# Patient Record
Sex: Male | Born: 1975
Health system: Southern US, Community
[De-identification: ages and names within clinical notes are randomized; demographics above are authoritative.]

## PROBLEM LIST (undated history)

## (undated) DIAGNOSIS — T8859XA Other complications of anesthesia, initial encounter: Secondary | ICD-10-CM

## (undated) DIAGNOSIS — F419 Anxiety disorder, unspecified: Secondary | ICD-10-CM

## (undated) DIAGNOSIS — R112 Nausea with vomiting, unspecified: Secondary | ICD-10-CM

## (undated) DIAGNOSIS — R011 Cardiac murmur, unspecified: Secondary | ICD-10-CM

## (undated) DIAGNOSIS — I10 Essential (primary) hypertension: Secondary | ICD-10-CM

## (undated) DIAGNOSIS — E785 Hyperlipidemia, unspecified: Secondary | ICD-10-CM

## (undated) DIAGNOSIS — A159 Respiratory tuberculosis unspecified: Secondary | ICD-10-CM

## (undated) DIAGNOSIS — Q8740 Marfan's syndrome, unspecified: Secondary | ICD-10-CM

## (undated) DIAGNOSIS — F32A Depression, unspecified: Secondary | ICD-10-CM

## (undated) DIAGNOSIS — T4145XA Adverse effect of unspecified anesthetic, initial encounter: Secondary | ICD-10-CM

## (undated) DIAGNOSIS — G4730 Sleep apnea, unspecified: Secondary | ICD-10-CM

## (undated) DIAGNOSIS — F329 Major depressive disorder, single episode, unspecified: Secondary | ICD-10-CM

## (undated) DIAGNOSIS — Z9889 Other specified postprocedural states: Secondary | ICD-10-CM

## (undated) DIAGNOSIS — K219 Gastro-esophageal reflux disease without esophagitis: Secondary | ICD-10-CM

## (undated) DIAGNOSIS — G4733 Obstructive sleep apnea (adult) (pediatric): Secondary | ICD-10-CM

## (undated) DIAGNOSIS — E669 Obesity, unspecified: Secondary | ICD-10-CM

## (undated) HISTORY — PX: EYE SURGERY: SHX253

## (undated) HISTORY — DX: Hyperlipidemia, unspecified: E78.5

## (undated) HISTORY — DX: Essential (primary) hypertension: I10

## (undated) HISTORY — PX: OTHER SURGICAL HISTORY: SHX169

## (undated) HISTORY — PX: CARDIAC VALVE REPLACEMENT: SHX585

## (undated) HISTORY — DX: Obesity, unspecified: E66.9

## (undated) HISTORY — DX: Obstructive sleep apnea (adult) (pediatric): G47.33

## (undated) HISTORY — DX: Sleep apnea, unspecified: G47.30

---

## 2008-09-27 ENCOUNTER — Emergency Department (HOSPITAL_COMMUNITY): Admission: EM | Admit: 2008-09-27 | Discharge: 2008-09-27 | Payer: Self-pay | Admitting: Family Medicine

## 2013-02-26 ENCOUNTER — Other Ambulatory Visit: Payer: Self-pay | Admitting: Internal Medicine

## 2013-02-26 MED ORDER — INSULIN DETEMIR 100 UNIT/ML ~~LOC~~ SOLN: 30.0000 [IU] | Freq: Every day | SUBCUTANEOUS | Status: DC

## 2013-02-26 MED ORDER — INSULIN NPH (HUMAN) (ISOPHANE) 100 UNIT/ML ~~LOC~~ SUSP: 15.0000 [IU] | Freq: Two times a day (BID) | SUBCUTANEOUS | Status: DC

## 2013-09-18 ENCOUNTER — Encounter: Payer: Self-pay | Admitting: Sports Medicine

## 2013-09-18 ENCOUNTER — Ambulatory Visit (INDEPENDENT_AMBULATORY_CARE_PROVIDER_SITE_OTHER): Payer: 59 | Admitting: Sports Medicine

## 2013-09-18 ENCOUNTER — Ambulatory Visit (INDEPENDENT_AMBULATORY_CARE_PROVIDER_SITE_OTHER): Payer: 59

## 2013-09-18 VITALS — BP 133/85 | HR 66 | Ht >= 80 in | Wt 374.0 lb

## 2013-09-18 DIAGNOSIS — M25561 Pain in right knee: Secondary | ICD-10-CM

## 2013-09-18 DIAGNOSIS — M898X9 Other specified disorders of bone, unspecified site: Secondary | ICD-10-CM

## 2013-09-18 DIAGNOSIS — M25569 Pain in unspecified knee: Secondary | ICD-10-CM

## 2013-09-18 DIAGNOSIS — M25562 Pain in left knee: Principal | ICD-10-CM

## 2013-09-18 DIAGNOSIS — M25869 Other specified joint disorders, unspecified knee: Secondary | ICD-10-CM

## 2013-09-18 DIAGNOSIS — M25469 Effusion, unspecified knee: Secondary | ICD-10-CM

## 2013-09-18 DIAGNOSIS — M170 Bilateral primary osteoarthritis of knee: Secondary | ICD-10-CM | POA: Insufficient documentation

## 2013-09-18 DIAGNOSIS — Q8740 Marfan's syndrome, unspecified: Secondary | ICD-10-CM

## 2013-09-18 MED ORDER — IBUPROFEN 800 MG PO TABS: 800.0000 mg | ORAL_TABLET | Freq: Three times a day (TID) | ORAL | Status: DC | PRN

## 2013-09-18 NOTE — Assessment & Plan Note (Signed)
Most likely related to osteoarthritis, and likely degenerative meniscal tear. He tells me that he does have an anterior cruciate ligament rupture, but does desire to proceed with nonoperative measures. Bilateral injection as above, formal physical therapy, ibuprofen, x-rays. Return in one month. Patient will purchase knee sleeves on his own.

## 2013-09-18 NOTE — Progress Notes (Signed)
Subjective:    I'm seeing this patient as a consultation for:  Dr. Leonette MostKalish  CC: Bilateral knee pain  HPI: This is a very pleasant 38 year old male with a history of Marfan syndrome, post aortic root replacement, he's also had bunion surgery. For a long time now he's had bilateral knee pain, he has had MRIs in the past but it showed areas of torn cartilage per patient. 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 It was never decided to operate in the past, and he has decided to proceed with nonsurgical methods. He localizes the pain deep in the joint on both sides, moderate, persistent, mild swelling. No mechanical symptoms. He does have significant pain in the right knee with terminal flexion.  Past medical history, Surgical history, Family history not pertinant except as noted below, Social history, Allergies, and medications have been entered into the medical record, reviewed, and no changes needed.   Review of Systems: No headache, visual changes, nausea, vomiting, diarrhea, constipation, dizziness, abdominal pain, skin rash, fevers, chills, night sweats, weight loss, swollen lymph nodes, body aches, joint swelling, muscle aches, chest pain, shortness of breath, mood changes, visual or auditory hallucinations.   Objective:   General: Well Developed, well nourished, and in no acute distress.  Neuro/Psych: Alert and oriented x3, extra-ocular muscles intact, able to move all 4 extremities, sensation grossly intact. Skin: Warm and dry, no rashes noted.  Respiratory: Not using accessory muscles, speaking in full sentences, trachea midline.  Cardiovascular: Pulses palpable, no extremity edema. Abdomen: Does not appear distended. Bilateral Knee: Normal to inspection with no erythema or effusion or obvious bony abnormalities. Palpation normal with no warmth, joint line tenderness, patellar tenderness, or condyle tenderness. ROM full in flexion and  extension and lower leg rotation. Right knee pain with terminal flexion Ligaments with solid consistent endpoints including PCL, LCL, MCL.  Anterior cruciate ligament felt loose on both sides. Negative Mcmurray's, Apley's, and Thessalonian tests. Non painful patellar compression. Patellar glide without crepitus. Patellar and quadriceps tendons unremarkable. Hamstring and quadriceps strength is normal.   Procedure: Real-time Ultrasound Guided Injection of left knee Device: GE Logiq E  Verbal informed consent obtained.  Time-out conducted.  Noted no overlying erythema, induration, or other signs of local infection.  Skin prepped in a sterile fashion.  Local anesthesia: Topical Ethyl chloride.  With sterile technique and under real time ultrasound guidance:  2 cc Kenalog 40, 4 cc lidocaine injected into the suprapatellar recess. Completed without difficulty  Pain immediately resolved suggesting accurate placement of the medication.  Advised to call if fevers/chills, erythema, induration, drainage, or persistent bleeding.  Images permanently stored and available for review in the ultrasound unit.  Impression: Technically successful ultrasound guided injection.  Procedure: Real-time Ultrasound Guided Injection of right knee Device: GE Logiq E  Verbal informed consent obtained.  Time-out conducted.  Noted no overlying erythema, induration, or other signs of local infection.  Skin prepped in a sterile fashion.  Local anesthesia: Topical Ethyl chloride.  With sterile technique and under real time ultrasound guidance:  2 cc Kenalog 40, 4 cc lidocaine injected into the suprapatellar recess. Completed without difficulty  Pain immediately resolved suggesting accurate placement of the medication.  Advised to call if fevers/chills, erythema, induration, drainage, or persistent bleeding.  Images permanently stored and available for review in the ultrasound unit.  Impression: Technically successful  ultrasound guided injection.  Impression and Recommendations:   This case required medical decision  making of moderate complexity.

## 2013-09-26 ENCOUNTER — Encounter: Payer: 59 | Admitting: Sports Medicine

## 2013-10-02 ENCOUNTER — Ambulatory Visit: Payer: 59 | Attending: Sports Medicine | Admitting: Physical Therapy

## 2013-10-02 DIAGNOSIS — M25569 Pain in unspecified knee: Secondary | ICD-10-CM | POA: Insufficient documentation

## 2013-10-02 DIAGNOSIS — IMO0001 Reserved for inherently not codable concepts without codable children: Secondary | ICD-10-CM | POA: Insufficient documentation

## 2013-10-09 ENCOUNTER — Encounter: Payer: 59 | Admitting: Physical Therapy

## 2013-10-16 ENCOUNTER — Ambulatory Visit: Payer: 59 | Admitting: Physical Therapy

## 2013-10-16 ENCOUNTER — Encounter: Payer: 59 | Admitting: Sports Medicine

## 2013-10-17 ENCOUNTER — Ambulatory Visit (INDEPENDENT_AMBULATORY_CARE_PROVIDER_SITE_OTHER): Payer: 59 | Admitting: Sports Medicine

## 2013-10-17 ENCOUNTER — Encounter: Payer: Self-pay | Admitting: Sports Medicine

## 2013-10-17 VITALS — BP 137/88 | HR 63 | Ht >= 80 in | Wt 379.0 lb

## 2013-10-17 DIAGNOSIS — M25569 Pain in unspecified knee: Secondary | ICD-10-CM

## 2013-10-17 DIAGNOSIS — M25561 Pain in right knee: Secondary | ICD-10-CM

## 2013-10-17 DIAGNOSIS — M25562 Pain in left knee: Principal | ICD-10-CM

## 2013-10-17 NOTE — Assessment & Plan Note (Signed)
Essentially resolved after injection. Custom orthotics as above. Return as needed, I can add a scaphoid as he does have very severe pes planus.

## 2013-10-17 NOTE — Progress Notes (Signed)
    Patient was fitted for a : standard, cushioned, semi-rigid orthotic. The orthotic was heated and afterward the patient stood on the orthotic blank positioned on the orthotic stand. The patient was positioned in subtalar neutral position and 10 degrees of ankle dorsiflexion in a weight bearing stance. After completion of molding, a stable base was applied to the orthotic blank. The blank was ground to a stable position for weight bearing. Size: 18 Base: Blue EVA Additional Posting and Padding: None The patient ambulated these, and they were very comfortable.  I spent 40 minutes with this patient, greater than 50% was face-to-face time counseling regarding the below diagnosis.

## 2013-10-18 ENCOUNTER — Ambulatory Visit: Payer: 59 | Admitting: Physical Therapy

## 2013-10-19 ENCOUNTER — Encounter: Payer: Self-pay | Admitting: Sports Medicine

## 2013-10-19 ENCOUNTER — Ambulatory Visit (INDEPENDENT_AMBULATORY_CARE_PROVIDER_SITE_OTHER): Payer: 59 | Admitting: Sports Medicine

## 2013-10-19 VITALS — BP 119/67 | HR 87 | Ht >= 80 in | Wt 376.0 lb

## 2013-10-19 DIAGNOSIS — M25561 Pain in right knee: Secondary | ICD-10-CM

## 2013-10-19 DIAGNOSIS — M25569 Pain in unspecified knee: Secondary | ICD-10-CM

## 2013-10-19 DIAGNOSIS — M25562 Pain in left knee: Principal | ICD-10-CM

## 2013-10-19 NOTE — Assessment & Plan Note (Signed)
Essentially resolved with custom orthotics and injection of month ago. Return as needed, we can always do a repeat steroid injection, if pain recurs within 2 months we can proceed with Visco supplementation.

## 2013-10-19 NOTE — Progress Notes (Signed)
  Subjective:    CC: Followup  HPI: Knee osteoarthritis: Robert Stevenson is a pleasant 38 year old male with Marfan syndrome, multiple we injected both knees, and recently we made him custom orthotics, is happy with the results of far, and does not desire any further interventional treatment.  Past medical history, Surgical history, Family history not pertinant except as noted below, Social history, Allergies, and medications have been entered into the medical record, reviewed, and no changes needed.   Review of Systems: No fevers, chills, night sweats, weight loss, chest pain, or shortness of breath.   Objective:    General: Well Developed, well nourished, and in no acute distress.  Neuro: Alert and oriented x3, extra-ocular muscles intact, sensation grossly intact.  HEENT: Normocephalic, atraumatic, pupils equal round reactive to light, neck supple, no masses, no lymphadenopathy, thyroid nonpalpable.  Skin: Warm and dry, no rashes. Cardiac: Regular rate and rhythm, no murmurs rubs or gallops, no lower extremity edema.  Respiratory: Clear to auscultation bilaterally. Not using accessory muscles, speaking in full sentences.  Impression and Recommendations:

## 2013-10-23 ENCOUNTER — Ambulatory Visit: Payer: 59 | Admitting: Physical Therapy

## 2013-10-25 ENCOUNTER — Ambulatory Visit: Payer: 59 | Admitting: Physical Therapy

## 2013-11-06 ENCOUNTER — Ambulatory Visit: Payer: 59 | Attending: Sports Medicine | Admitting: Physical Therapy

## 2013-11-06 DIAGNOSIS — IMO0001 Reserved for inherently not codable concepts without codable children: Secondary | ICD-10-CM | POA: Insufficient documentation

## 2013-11-06 DIAGNOSIS — M25569 Pain in unspecified knee: Secondary | ICD-10-CM | POA: Insufficient documentation

## 2013-11-09 ENCOUNTER — Encounter: Payer: Self-pay | Admitting: Dietician

## 2013-11-09 ENCOUNTER — Encounter: Payer: 59 | Attending: Family Medicine | Admitting: Dietician

## 2013-11-09 VITALS — Ht >= 80 in | Wt 378.0 lb

## 2013-11-09 DIAGNOSIS — Z713 Dietary counseling and surveillance: Secondary | ICD-10-CM | POA: Insufficient documentation

## 2013-11-09 DIAGNOSIS — E669 Obesity, unspecified: Secondary | ICD-10-CM | POA: Diagnosis not present

## 2013-11-09 NOTE — Patient Instructions (Addendum)
Eat breakfast every day.  Choose protein shakes or cereal bars or greek yogurts with 10 grams or less than sugar.  If eating breakfast or other meals out choose whole wheat grain options, lean proteins, choosing vegetables and fruits as sides, and be careful of added calories and fats in things like condiments and dressings.  Take the time every 4-5 hours to check in with yourself and assess your hunger.  If hungry eat snacks between meals. Choose small changes that you feel are sustainable and build upon those as we gradually build-on your healthy eating habits. Try and choose water or 0 calorie beverages more often than sweet tea or fruit juices.  Try ordering half unsweet half sweet or making tea at home with splenda.  If water is getting boring use crystal light or fruit wedges of lemon or lime to flavor it. Continue to exercise!!

## 2013-11-09 NOTE — Progress Notes (Signed)
Medical Nutrition Therapy:  Appt start time: 0800 end time:  0900.   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  Samvel's highest weight was 431 in 2008.  In 2010/11 he moved to [facility] and lost 50 lbs with exercise, down to 380 lbs.  After he had heart surgery in 2012 he gained back up to 400 lbs.  He then joined [employer] and lost down to 350 lbs.  After a couple eye surgeries he regained some weight, and joined an exercise program at [facility] which he recently completed.  Raynaldo is thinking of rejoining weight watchers but wanted to meet with a dietitian first before deciding his next plan of action.  His weight loss goal is to lose 100 lbs down to 278 lbs.  I calculate his IBW to be approximately 255 lbs. Zaydan lives with his wife and his daughter and states his wife does most of the food shopping and cooking.  He recently moved homes and he states with their schedule they go out to eat a lot, maybe 10-12 meals a week.  They go to places like [employer], [place], [place].  At fast-food places he states he will often order a combo plus something off the dollar menu and has recently observed that he is not really hungry for it and that he has been ignoring his hunger and fullness.  He states he is not a big regular soda drinker (will drink diet) but he loves sweet tea and fruit juices.    Preferred Learning Style all of the following:  Auditory  Visual  Hands on  Learning Readiness:  Ready  MEDICATIONS: see list   DIETARY INTAKE:  24-hr recall:  B ( AM): skips breakfast, or get [employer], [employer], [place]: combo chicken biscuit, fries, OJ  Snk ( AM): none  2 pm L ( PM): Cafeteria at [employer] , pasta plate (shrimp tortellini) or [employer] 4-5 pm Snk ( PM): buffalo potato chips by [employer] or trail mix D ( PM): out to eat [employer], etc. Snk ( PM): none Beverages:  water, diet Dr. Reino Kent, diet Mtn. Dew, sweet tea, fruit juices  Usual physical activity: none currently.  Just finished an exercise program and plans to start working out at the gym facilities in his new apartment complex.  Progress Towards Goal(s):  In progress.   Nutritional Diagnosis:  [place]-3.3 Overweight/obesity As related to frequent consumption of foods outside of the home, large portion sizes, and mindless eating.  As evidenced by dietary recall and BMI of 40.6.Marland Kitchen    Intervention:  Nutrition counseling and education.  Discussed importance of eating breakfast and eating regular smaller meals throughout the day.  Encouraged Mataeo to get back in tune with his hunger and fullness and really take the time to think about what will satisfy him.  Outlined for Diontre that it takes approximately a 3500 calorie deduction to lose 1 lb and that this can be reached by decreasing calories (500 calories a day) and increasing exercise (30-60 min 5-7 days a week).  Discussed importance of choosing more fiber and protein to replace simple sugars and starches to help with staying power so that he does not feel hungry an hour or two after eating a meal.  Together we came up with the following plan:  Eat breakfast every day.  Choose protein shakes or cereal bars or greek yogurts with 10 grams or less  than sugar.  If eating out for breakfast or other meals choose whole wheat grain options, lean proteins, choosing vegetables and fruits as sides, and be careful of added calories and fats in things like condiments and dressings.  Take the time every 4-5 hours to check in with yourself and assess your hunger.  If hungry eat snacks between meals. Choose small changes that you feel are sustainable and build upon those as we gradually build-on your healthy eating habits. Try and choose water or 0 calorie beverages more often than sweet tea or fruit juices.  Try ordering half unsweet half sweet or making tea at home with  splenda.  If water is getting boring use crystal light or fruit wedges of lemon or lime to flavor it. Continue to exercise!!  Teaching Method Utilized all of the following: Visual Auditory Hands on  Handouts given during visit include:  Low Carb Snacks  Barriers to learning/adherence to lifestyle change: none  Demonstrated degree of understanding via:  Teach Back   Monitoring/Evaluation:  Dietary intake, exercise, portion control, and body weight prn.

## 2013-11-13 ENCOUNTER — Ambulatory Visit: Payer: 59

## 2013-11-29 ENCOUNTER — Ambulatory Visit: Payer: 59 | Admitting: Sports Medicine

## 2014-01-09 ENCOUNTER — Encounter: Payer: Self-pay | Admitting: Sports Medicine

## 2014-01-09 ENCOUNTER — Ambulatory Visit (INDEPENDENT_AMBULATORY_CARE_PROVIDER_SITE_OTHER): Payer: 59 | Admitting: Sports Medicine

## 2014-01-09 VITALS — BP 137/90 | HR 71 | Ht >= 80 in | Wt 389.0 lb

## 2014-01-09 DIAGNOSIS — M170 Bilateral primary osteoarthritis of knee: Secondary | ICD-10-CM

## 2014-01-09 DIAGNOSIS — M1710 Unilateral primary osteoarthritis, unspecified knee: Secondary | ICD-10-CM

## 2014-01-09 NOTE — Assessment & Plan Note (Addendum)
Last injection was 4 months ago, right knee is doing well, left knee is hurting.  Scaphoid pad added into left orthotic. Repeat injection. Return as needed.

## 2014-01-09 NOTE — Progress Notes (Signed)
  Subjective:    CC: Left knee pain  HPI: This is a very pleasant 38 year old male with a history of Marfan syndrome, he has knee osteoarthritis, I performed bilateral knee injection over 4 months ago, the right knee is doing well, left knee is starting to hurt again. He desires repeat interventional treatment. He is also doing extremely well with his custom orthotics and desires a higher arch. Pain is moderate, persistent without radiation.  Past medical history, Surgical history, Family history not pertinant except as noted below, Social history, Allergies, and medications have been entered into the medical record, reviewed, and no changes needed.   Review of Systems: No fevers, chills, night sweats, weight loss, chest pain, or shortness of breath.   Objective:    General: Well Developed, well nourished, and in no acute distress.  Neuro: Alert and oriented x3, extra-ocular muscles intact, sensation grossly intact.  HEENT: Normocephalic, atraumatic, pupils equal round reactive to light, neck supple, no masses, no lymphadenopathy, thyroid nonpalpable.  Skin: Warm and dry, no rashes. Cardiac: Regular rate and rhythm, no murmurs rubs or gallops, no lower extremity edema.  Respiratory: Clear to auscultation bilaterally. Not using accessory muscles, speaking in full sentences.  Procedure: Real-time Ultrasound Guided Injection of left knee Device: GE Logiq E  Verbal informed consent obtained.  Time-out conducted.  Noted no overlying erythema, induration, or other signs of local infection.  Skin prepped in a sterile fashion.  Local anesthesia: Topical Ethyl chloride.  With sterile technique and under real time ultrasound guidance:  2 cc kenalog 40, 4 cc lidocaine injected easily. Completed without difficulty  Pain immediately resolved suggesting accurate placement of the medication.  Advised to call if fevers/chills, erythema, induration, drainage, or persistent bleeding.  Images permanently  stored and available for review in the ultrasound unit.  Impression: Technically successful ultrasound guided injection.  Impression and Recommendations:

## 2014-02-07 ENCOUNTER — Ambulatory Visit (INDEPENDENT_AMBULATORY_CARE_PROVIDER_SITE_OTHER): Payer: 59

## 2014-02-07 ENCOUNTER — Ambulatory Visit (INDEPENDENT_AMBULATORY_CARE_PROVIDER_SITE_OTHER): Payer: 59 | Admitting: Sports Medicine

## 2014-02-07 ENCOUNTER — Encounter: Payer: Self-pay | Admitting: Sports Medicine

## 2014-02-07 VITALS — BP 136/84 | HR 70 | Ht >= 80 in | Wt 388.0 lb

## 2014-02-07 DIAGNOSIS — M4806 Spinal stenosis, lumbar region: Secondary | ICD-10-CM

## 2014-02-07 DIAGNOSIS — M545 Low back pain, unspecified: Secondary | ICD-10-CM

## 2014-02-07 DIAGNOSIS — M170 Bilateral primary osteoarthritis of knee: Secondary | ICD-10-CM

## 2014-02-07 DIAGNOSIS — M4807 Spinal stenosis, lumbosacral region: Secondary | ICD-10-CM

## 2014-02-07 DIAGNOSIS — M4186 Other forms of scoliosis, lumbar region: Secondary | ICD-10-CM

## 2014-02-07 NOTE — Assessment & Plan Note (Signed)
Resolved after injection. Doing well with custom orthotics.

## 2014-02-07 NOTE — Assessment & Plan Note (Signed)
Symptoms do sound predominantly discogenic. Continue Mobic, adding physical therapy, x-rays. Considering history of Marfan syndrome we certainly do need to keep the possibility of dural ectasia in her differential. Return in one month.

## 2014-02-07 NOTE — Progress Notes (Signed)
  Subjective:    CC: Followup  HPI: This is a very pleasant 38 year old male with a history of Marfan syndrome, post lens and aortic root replacement. I have been seeing him for knee osteoarthritis, he is doing very well with custom orthotics, Mobic, and post injection at the last visit.  Low back pain: Left-sided low back, worse with going from sitting to standing, no radicular symptoms, not worse with flexion, Valsalva, or extension, no nighttime symptoms. No constitutional symptoms, no bowel or bladder dysfunction. Moderate, persistent.  Past medical history, Surgical history, Family history not pertinant except as noted below, Social history, Allergies, and medications have been entered into the medical record, reviewed, and no changes needed.   Review of Systems: No fevers, chills, night sweats, weight loss, chest pain, or shortness of breath.   Objective:    General: Well Developed, well nourished, and in no acute distress.  Neuro: Alert and oriented x3, extra-ocular muscles intact, sensation grossly intact.  HEENT: Normocephalic, atraumatic, pupils equal round reactive to light, neck supple, no masses, no lymphadenopathy, thyroid nonpalpable.  Skin: Warm and dry, no rashes. Cardiac: Regular rate and rhythm, no murmurs rubs or gallops, no lower extremity edema.  Respiratory: Clear to auscultation bilaterally. Not using accessory muscles, speaking in full sentences. Back Exam:  Inspection: Unremarkable  Motion: Flexion 45 deg, Extension 45 deg, Side Bending to 45 deg bilaterally,  Rotation to 45 deg bilaterally  SLR laying: Negative  XSLR laying: Negative  Palpable tenderness: Left lower paralumbar muscle. FABER: negative. Sensory change: Gross sensation intact to all lumbar and sacral dermatomes.  Reflexes: 2+ at both patellar tendons, 2+ at achilles tendons, Babinski's downgoing.  Strength at foot  Plantar-flexion: 5/5 Dorsi-flexion: 5/5 Eversion: 5/5 Inversion: 5/5  Leg  strength  Quad: 5/5 Hamstring: 5/5 Hip flexor: 5/5 Hip abductors: 5/5  Gait unremarkable.  Impression and Recommendations:    I spent 40 minutes with this patient, greater than 50% was face-to-face time counseling regarding the above diagnosis.

## 2014-02-22 ENCOUNTER — Telehealth: Payer: Self-pay

## 2014-02-22 NOTE — Telephone Encounter (Signed)
Patient called and cancelled follow up appt he stated that his back pain is gone since he took the heel lift out his shoe and also his knee pain is better.  Patient also wants to cancel PT.Rhonda Cunningham,CMA

## 2014-03-07 ENCOUNTER — Ambulatory Visit: Payer: 59 | Admitting: Sports Medicine

## 2014-04-26 ENCOUNTER — Emergency Department (HOSPITAL_BASED_OUTPATIENT_CLINIC_OR_DEPARTMENT_OTHER)
Admission: EM | Admit: 2014-04-26 | Discharge: 2014-04-26 | Disposition: A | Discharge status: Home / Self Care | Payer: 59 | Attending: Emergency Medicine | Admitting: Emergency Medicine

## 2014-04-26 ENCOUNTER — Encounter (HOSPITAL_BASED_OUTPATIENT_CLINIC_OR_DEPARTMENT_OTHER): Payer: Self-pay | Admitting: *Deleted

## 2014-04-26 DIAGNOSIS — Z8669 Personal history of other diseases of the nervous system and sense organs: Secondary | ICD-10-CM | POA: Diagnosis not present

## 2014-04-26 DIAGNOSIS — E860 Dehydration: Secondary | ICD-10-CM | POA: Insufficient documentation

## 2014-04-26 DIAGNOSIS — E785 Hyperlipidemia, unspecified: Secondary | ICD-10-CM | POA: Insufficient documentation

## 2014-04-26 DIAGNOSIS — I10 Essential (primary) hypertension: Secondary | ICD-10-CM | POA: Insufficient documentation

## 2014-04-26 DIAGNOSIS — R509 Fever, unspecified: Secondary | ICD-10-CM | POA: Diagnosis present

## 2014-04-26 DIAGNOSIS — E669 Obesity, unspecified: Secondary | ICD-10-CM | POA: Insufficient documentation

## 2014-04-26 DIAGNOSIS — J029 Acute pharyngitis, unspecified: Secondary | ICD-10-CM | POA: Insufficient documentation

## 2014-04-26 DIAGNOSIS — Z791 Long term (current) use of non-steroidal anti-inflammatories (NSAID): Secondary | ICD-10-CM | POA: Diagnosis not present

## 2014-04-26 DIAGNOSIS — Z79899 Other long term (current) drug therapy: Secondary | ICD-10-CM | POA: Diagnosis not present

## 2014-04-26 LAB — BASIC METABOLIC PANEL
Anion gap: 7 (ref 5–15)
BUN: 15 mg/dL (ref 6–23)
CO2: 26 mmol/L (ref 19–32)
Calcium: 9 mg/dL (ref 8.4–10.5)
Chloride: 104 mEq/L (ref 96–112)
Creatinine, Ser: 1.21 mg/dL (ref 0.50–1.35)
GFR calc Af Amer: 88 mL/min — ABNORMAL LOW (ref >90)
GFR calc non Af Amer: 76 mL/min — ABNORMAL LOW (ref >90)
Glucose, Bld: 95 mg/dL (ref 70–99)
POTASSIUM: 3.9 mmol/L (ref 3.5–5.1)
SODIUM: 137 mmol/L (ref 135–145)

## 2014-04-26 LAB — CBC
HCT: 41.4 % (ref 39.0–52.0)
HEMOGLOBIN: 13.8 g/dL (ref 13.0–17.0)
MCH: 27.5 pg (ref 26.0–34.0)
MCHC: 33.3 g/dL (ref 30.0–36.0)
MCV: 82.5 fL (ref 78.0–100.0)
PLATELETS: 189 10*3/uL (ref 150–400)
RBC: 5.02 MIL/uL (ref 4.22–5.81)
RDW: 14.4 % (ref 11.5–15.5)
WBC: 5.4 10*3/uL (ref 4.0–10.5)

## 2014-04-26 LAB — RAPID STREP SCREEN (MED CTR MEBANE ONLY): Streptococcus, Group A Screen (Direct): NEGATIVE

## 2014-04-26 MED ORDER — ACETAMINOPHEN 325 MG PO TABS
650.0000 mg | ORAL_TABLET | Freq: Once | ORAL | Status: AC
  Administered 2014-04-26: 650 mg via ORAL

## 2014-04-26 MED ORDER — SODIUM CHLORIDE 0.9 % IV BOLUS (SEPSIS): 1000.0000 mL | Freq: Once | INTRAVENOUS | Status: DC

## 2014-04-26 MED ORDER — ACETAMINOPHEN 325 MG PO TABS
ORAL_TABLET | ORAL | Status: AC
  Administered 2014-04-26: 650 mg via ORAL
  Filled 2014-04-26: qty 2

## 2014-04-26 MED ORDER — SODIUM CHLORIDE 0.9 % IV BOLUS (SEPSIS)
1000.0000 mL | Freq: Once | INTRAVENOUS | Status: AC
  Administered 2014-04-26: 1000 mL via INTRAVENOUS

## 2014-04-26 NOTE — ED Notes (Signed)
MD at bedside. 

## 2014-04-26 NOTE — ED Notes (Signed)
Pt reports fever, sore throat, dizzy since 1030 today. Walked to triage with steady gait

## 2014-04-26 NOTE — ED Provider Notes (Signed)
CSN: [id number]     Arrival date & time 04/26/14  1446 History   First MD Initiated Contact with Patient 04/26/14 1543     Chief Complaint  Patient presents with  . Fever  . Dizziness     (Consider location/radiation/quality/duration/timing/severity/associated sxs/prior Treatment) HPI  Pt presenting with c/o sore throat and fever which began this morning.  No cough, some nasal congestion.  Denies body aches.  Has been around daughter and wife with viral URI infections as well last week.  No vomiting or diarrhea.  No difficulty breathing or swallowing.  No rash.  No abdominal pain.  States that fever was 103 this morning and he felt very lightheaded with this.  Took both motrin and tylenol and is starting to feel improved now that fever is down.  There are no other associated systemic symptoms, there are no other alleviating or modifying factors.   Past Medical History  Diagnosis Date  . Hypertension   . Hyperlipidemia   . Obesity   . Sleep apnea    Past Surgical History  Procedure Laterality Date  . Eye surgery    . Cardiac valve replacement     Family History  Problem Relation Age of Onset  . Diabetes Mother   . Hypertension Father   . Diabetes Father    History  Substance Use Topics  . Smoking status: Never Smoker   . Smokeless tobacco: Not on file  . Alcohol Use: Yes     Comment: rare    Review of Systems  ROS reviewed and all otherwise negative except for mentioned in HPI    Allergies  Review of patient's allergies indicates no known allergies.  Home Medications   Prior to Admission medications   Medication Sig Start Date End Date Taking? Authorizing Provider  atorvastatin (LIPITOR) 10 MG tablet Take 10 mg by mouth daily.   Yes Historical Provider, MD  buPROPion (WELLBUTRIN XL) 300 MG 24 hr tablet Take 300 mg by mouth daily.   Yes Historical Provider, MD  citalopram (CELEXA) 20 MG tablet Take 20 mg by mouth daily.   Yes Historical Provider, MD  losartan  (COZAAR) 50 MG tablet Take 75 mg by mouth daily.   Yes Historical Provider, MD  Multiple Vitamin (MULTIVITAMIN) tablet Take 1 tablet by mouth daily.   Yes Historical Provider, MD  meloxicam (MOBIC) 15 MG tablet Take 15 mg by mouth daily.    Historical Provider, MD   BP 126/75 mmHg  Pulse 83  Temp(Src) 99.2 F (37.3 C) (Oral)  Resp 16  Ht 6\' 9"  (2.057 m)  Wt 395 lb (179.171 kg)  BMI 42.34 kg/m2  SpO2 98%  Vitals reviewed Physical Exam  Physical Examination: General appearance - alert, well appearing, and in no distress Mental status - alert, oriented to person, place, and time Eyes - no conjunctival injection, no scleral icterus Mouth - mucous membranes moist, tonsillar erythema with mild exudate, palate symmetric, uvula midline Neck - supple, no significant adenopathy Chest - clear to auscultation, no wheezes, rales or rhonchi, symmetric air entry Heart - normal rate, regular rhythm, normal S1, S2, no murmurs, rubs, clicks or gallops Extremities - peripheral pulses normal, no pedal edema, no clubbing or cyanosis  Skin - normal coloration and turgor, no rashes  ED Course  Procedures (including critical care time) Labs Review Labs Reviewed  BASIC METABOLIC PANEL - Abnormal; Notable for the following:    GFR calc non Af Amer 76 (*)    GFR calc Af  Amer 88 (*)    All other components within normal limits  RAPID STREP SCREEN  CULTURE, GROUP A STREP  CBC    Imaging Review No results found.   EKG Interpretation None      MDM   Final diagnoses:  Viral pharyngitis  Febrile illness  Dehydration    Pt with fever, pharyngitis- rapid strep negative.  Pt with some hypotension on orthostatic vital signs, IV placed and given IV fluids with improvement in BP- advised symptomatic care, increase fluids, rest.  Discharged with strict return precautions.  Pt agreeable with plan.    Ethelda ChickMartha K Linker, MD 04/28/14 [phone]

## 2014-04-26 NOTE — Discharge Instructions (Signed)
Return to the ED with any concerns including difficulty breathing, vomiting, fainting, decreased level of alertness/lethargy, or any other alarming symptoms °

## 2014-04-28 LAB — CULTURE, GROUP A STREP

## 2014-06-14 ENCOUNTER — Ambulatory Visit (INDEPENDENT_AMBULATORY_CARE_PROVIDER_SITE_OTHER): Payer: 59 | Admitting: Sports Medicine

## 2014-06-14 ENCOUNTER — Encounter: Payer: Self-pay | Admitting: Sports Medicine

## 2014-06-14 VITALS — BP 133/80 | HR 73 | Ht >= 80 in | Wt >= 6400 oz

## 2014-06-14 DIAGNOSIS — Q8740 Marfan's syndrome, unspecified: Secondary | ICD-10-CM

## 2014-06-14 DIAGNOSIS — M170 Bilateral primary osteoarthritis of knee: Secondary | ICD-10-CM

## 2014-06-14 NOTE — Progress Notes (Signed)
  Subjective:    CC: Left knee pain  HPI: This is a very pleasant 39 year old male with known primary osteoarthritis of both knees, worse on the left. His most recent injection was in September of last year, he's been very well until recently, now having a recurrence of pain and swelling localized at the joint line and in the suprapatellar recess, moderate, persistent without radiation.  Left shoulder pain: Sensation of instability, worse with all directions, does have a history of Marfan syndrome.  Past medical history, Surgical history, Family history not pertinant except as noted below, Social history, Allergies, and medications have been entered into the medical record, reviewed, and no changes needed.   Review of Systems: No fevers, chills, night sweats, weight loss, chest pain, or shortness of breath.   Objective:    General: Well Developed, well nourished, and in no acute distress.  Neuro: Alert and oriented x3, extra-ocular muscles intact, sensation grossly intact.  HEENT: Normocephalic, atraumatic, pupils equal round reactive to light, neck supple, no masses, no lymphadenopathy, thyroid nonpalpable.  Skin: Warm and dry, no rashes. Cardiac: Regular rate and rhythm, no murmurs rubs or gallops, no lower extremity edema.  Respiratory: Clear to auscultation bilaterally. Not using accessory muscles, speaking in full sentences. Left Knee: Swollen with a visible and palpable effusion and a fluid wave. ROM normal in flexion and extension and lower leg rotation. Ligaments with solid consistent endpoints including ACL, PCL, LCL, MCL. Negative Mcmurray's and provocative meniscal tests. Non painful patellar compression. Patellar and quadriceps tendons unremarkable. Hamstring and quadriceps strength is normal.  Procedure: Real-time Ultrasound Guided Injection of left knee Device: GE Logiq E  Verbal informed consent obtained.  Time-out conducted.  Noted no overlying erythema, induration,  or other signs of local infection.  Skin prepped in a sterile fashion.  Local anesthesia: Topical Ethyl chloride.  With sterile technique and under real time ultrasound guidance:  44 mL of straw-colored fluid aspirated, syringe switched and 2 mL kenalog 40, 4 mL lidocaine injected easily. Completed without difficulty  Pain immediately resolved suggesting accurate placement of the medication.  Advised to call if fevers/chills, erythema, induration, drainage, or persistent bleeding.  Images permanently stored and available for review in the ultrasound unit.  Impression: Technically successful ultrasound guided injection.  Impression and Recommendations:

## 2014-06-14 NOTE — Assessment & Plan Note (Signed)
There is significant hypermobility, and likely multidirectional instability of the left glenohumeral joint. This is likely due to the Marfan syndrome, we are going to do aggressive home rehabilitation exercises for the rotator cuff. He is also getting some swelling in the lower extremities, we are going to do an echocardiogram, comprehensive metabolic panel to evaluate for renal and hepatic issues. If all of the above are negative treatment will center around lower extremity compression stockings.

## 2014-06-14 NOTE — Assessment & Plan Note (Signed)
It has been 5 months since the last left knee injection, good response until recently, repeat aspiration and injection today. Return as needed.

## 2014-06-15 LAB — COMPREHENSIVE METABOLIC PANEL WITH GFR
ALT: 22 U/L (ref 0–53)
Alkaline Phosphatase: 63 U/L (ref 39–117)
CO2: 27 meq/L (ref 19–32)
Sodium: 137 meq/L (ref 135–145)

## 2014-06-15 LAB — COMPREHENSIVE METABOLIC PANEL
AST: 24 U/L (ref 0–37)
Albumin: 3.8 g/dL (ref 3.5–5.2)
BUN: 14 mg/dL (ref 6–23)
Calcium: 8.8 mg/dL (ref 8.4–10.5)
Chloride: 101 mEq/L (ref 96–112)
Creat: 1.03 mg/dL (ref 0.50–1.35)
Glucose, Bld: 86 mg/dL (ref 70–99)
Potassium: 4.4 mEq/L (ref 3.5–5.3)
Total Bilirubin: 0.7 mg/dL (ref 0.2–1.2)
Total Protein: 7.2 g/dL (ref 6.0–8.3)

## 2014-06-21 ENCOUNTER — Other Ambulatory Visit (HOSPITAL_COMMUNITY): Payer: Self-pay | Admitting: Sports Medicine

## 2014-06-21 ENCOUNTER — Ambulatory Visit (HOSPITAL_COMMUNITY)
Admission: RE | Admit: 2014-06-21 | Discharge: 2014-06-21 | Disposition: A | Discharge status: Home / Self Care | Payer: 59 | Source: Ambulatory Visit | Attending: Sports Medicine | Admitting: Sports Medicine

## 2014-06-21 DIAGNOSIS — Q8740 Marfan's syndrome, unspecified: Secondary | ICD-10-CM | POA: Diagnosis not present

## 2014-06-21 DIAGNOSIS — R609 Edema, unspecified: Secondary | ICD-10-CM

## 2014-06-21 DIAGNOSIS — I509 Heart failure, unspecified: Secondary | ICD-10-CM

## 2014-06-21 NOTE — Progress Notes (Signed)
  Echocardiogram 2D Echocardiogram has been performed.  Robert Stevenson 06/21/2014, 9:06 AM

## 2014-06-26 ENCOUNTER — Ambulatory Visit (HOSPITAL_BASED_OUTPATIENT_CLINIC_OR_DEPARTMENT_OTHER): Payer: 59

## 2014-07-26 ENCOUNTER — Ambulatory Visit: Payer: 59 | Admitting: Sports Medicine

## 2014-10-18 ENCOUNTER — Other Ambulatory Visit: Payer: Self-pay | Admitting: Sports Medicine

## 2014-11-13 ENCOUNTER — Encounter: Payer: Self-pay | Admitting: *Deleted

## 2014-11-13 DIAGNOSIS — I10 Essential (primary) hypertension: Secondary | ICD-10-CM | POA: Diagnosis not present

## 2014-11-13 DIAGNOSIS — Z6841 Body Mass Index (BMI) 40.0 and over, adult: Secondary | ICD-10-CM | POA: Diagnosis not present

## 2014-11-13 DIAGNOSIS — Y848 Other medical procedures as the cause of abnormal reaction of the patient, or of later complication, without mention of misadventure at the time of the procedure: Secondary | ICD-10-CM | POA: Diagnosis not present

## 2014-11-13 DIAGNOSIS — H5989 Other postprocedural complications and disorders of eye and adnexa, not elsewhere classified: Secondary | ICD-10-CM | POA: Diagnosis present

## 2014-11-13 DIAGNOSIS — G4730 Sleep apnea, unspecified: Secondary | ICD-10-CM | POA: Diagnosis not present

## 2014-11-13 DIAGNOSIS — M1990 Unspecified osteoarthritis, unspecified site: Secondary | ICD-10-CM | POA: Diagnosis not present

## 2014-11-13 DIAGNOSIS — Z79899 Other long term (current) drug therapy: Secondary | ICD-10-CM | POA: Diagnosis not present

## 2014-11-13 NOTE — OR Nursing (Signed)
Cleared by cardiologist Dr Yates DecampJay Ganji

## 2014-11-27 ENCOUNTER — Encounter: Payer: Self-pay | Admitting: *Deleted

## 2014-11-27 ENCOUNTER — Encounter
Admission: RE | Disposition: A | Discharge status: Home / Self Care | Payer: 59 | Source: Ambulatory Visit | Attending: Ophthalmology

## 2014-11-27 ENCOUNTER — Ambulatory Visit
Admission: RE | Admit: 2014-11-27 | Discharge: 2014-11-27 | Disposition: A | Discharge status: Home / Self Care | Payer: 59 | Source: Ambulatory Visit | Attending: Ophthalmology | Admitting: Ophthalmology

## 2014-11-27 ENCOUNTER — Ambulatory Visit: Payer: 59 | Admitting: Certified Registered Nurse Anesthetist

## 2014-11-27 DIAGNOSIS — H5989 Other postprocedural complications and disorders of eye and adnexa, not elsewhere classified: Secondary | ICD-10-CM | POA: Insufficient documentation

## 2014-11-27 DIAGNOSIS — Y848 Other medical procedures as the cause of abnormal reaction of the patient, or of later complication, without mention of misadventure at the time of the procedure: Secondary | ICD-10-CM | POA: Insufficient documentation

## 2014-11-27 DIAGNOSIS — Z79899 Other long term (current) drug therapy: Secondary | ICD-10-CM | POA: Insufficient documentation

## 2014-11-27 DIAGNOSIS — Z6841 Body Mass Index (BMI) 40.0 and over, adult: Secondary | ICD-10-CM | POA: Insufficient documentation

## 2014-11-27 DIAGNOSIS — G4730 Sleep apnea, unspecified: Secondary | ICD-10-CM | POA: Insufficient documentation

## 2014-11-27 DIAGNOSIS — M1990 Unspecified osteoarthritis, unspecified site: Secondary | ICD-10-CM | POA: Insufficient documentation

## 2014-11-27 DIAGNOSIS — I10 Essential (primary) hypertension: Secondary | ICD-10-CM | POA: Insufficient documentation

## 2014-11-27 HISTORY — DX: Adverse effect of unspecified anesthetic, initial encounter: T41.45XA

## 2014-11-27 HISTORY — DX: Other specified postprocedural states: Z98.890

## 2014-11-27 HISTORY — DX: Other complications of anesthesia, initial encounter: T88.59XA

## 2014-11-27 HISTORY — DX: Depression, unspecified: F32.A

## 2014-11-27 HISTORY — DX: Major depressive disorder, single episode, unspecified: F32.9

## 2014-11-27 HISTORY — DX: Nausea with vomiting, unspecified: R11.2

## 2014-11-27 HISTORY — DX: Marfan syndrome, unspecified: Q87.40

## 2014-11-27 SURGERY — REMOVAL, SCLERAL BUCKLE
Anesthesia: Monitor Anesthesia Care | Site: Eye | Laterality: Left | Wound class: Clean

## 2014-11-27 MED ORDER — HYALURONIDASE HUMAN 150 UNIT/ML IJ SOLN
INTRAMUSCULAR | Status: AC
  Filled 2014-11-27: qty 1

## 2014-11-27 MED ORDER — LIDOCAINE HCL (PF) 4 % IJ SOLN
INTRAMUSCULAR | Status: AC
  Filled 2014-11-27: qty 5

## 2014-11-27 MED ORDER — DEXAMETHASONE SODIUM PHOSPHATE 10 MG/ML IJ SOLN
INTRAMUSCULAR | Status: AC
  Filled 2014-11-27: qty 1

## 2014-11-27 MED ORDER — CARBACHOL 0.01 % IO SOLN
INTRAOCULAR | Status: AC
  Filled 2014-11-27: qty 1.5

## 2014-11-27 MED ORDER — MIDAZOLAM HCL 2 MG/2ML IJ SOLN
INTRAMUSCULAR | Status: DC | PRN
  Administered 2014-11-27: 2 mg via INTRAVENOUS
  Administered 2014-11-27 (×2): 1 mg via INTRAVENOUS

## 2014-11-27 MED ORDER — SODIUM CHLORIDE 0.9 % IV SOLN
INTRAVENOUS | Status: DC
  Administered 2014-11-27: 07:00:00 via INTRAVENOUS

## 2014-11-27 MED ORDER — CYCLOPENTOLATE HCL 2 % OP SOLN
OPHTHALMIC | Status: AC
Start: 2014-11-27 — End: 2014-11-27
  Administered 2014-11-27: 1 [drp] via OPHTHALMIC
  Filled 2014-11-27: qty 2

## 2014-11-27 MED ORDER — TETRACAINE HCL 0.5 % OP SOLN
OPHTHALMIC | Status: DC | PRN
  Administered 2014-11-27: 2 [drp] via OPHTHALMIC

## 2014-11-27 MED ORDER — ATROPINE SULFATE 1 % OP SOLN
OPHTHALMIC | Status: AC
Start: 2014-11-27 — End: 2014-11-27
  Filled 2014-11-27: qty 5

## 2014-11-27 MED ORDER — LIDOCAINE HCL (PF) 4 % IJ SOLN
INTRAMUSCULAR | Status: DC | PRN
  Administered 2014-11-27: 5 mL via OPHTHALMIC

## 2014-11-27 MED ORDER — TETRACAINE HCL 0.5 % OP SOLN
OPHTHALMIC | Status: AC
  Filled 2014-11-27: qty 2

## 2014-11-27 MED ORDER — HYPROMELLOSE 0.3 % OP GEL
OPHTHALMIC | Status: AC
  Filled 2014-11-27: qty 3.5

## 2014-11-27 MED ORDER — CYCLOPENTOLATE HCL 2 % OP SOLN
1.0000 [drp] | OPHTHALMIC | Status: AC
  Administered 2014-11-27 (×3): 1 [drp] via OPHTHALMIC

## 2014-11-27 MED ORDER — CEFUROXIME OPHTHALMIC INJECTION 1 MG/0.1 ML
INJECTION | OPHTHALMIC | Status: DC | PRN
  Administered 2014-11-27: 0.1 mL via INTRACAMERAL

## 2014-11-27 MED ORDER — BUPIVACAINE HCL (PF) 0.75 % IJ SOLN
INTRAMUSCULAR | Status: AC
  Filled 2014-11-27: qty 10

## 2014-11-27 MED ORDER — CARBOXYMETHYLCELL-HYPROMELLOSE 0.25-0.3 % OP GEL
OPHTHALMIC | Status: DC | PRN
  Administered 2014-11-27: 50 mg via OPHTHALMIC

## 2014-11-27 MED ORDER — NEOMYCIN-POLYMYXIN-GRAMICIDIN 1.75-10000-.025 OP SOLN
OPHTHALMIC | Status: AC
  Filled 2014-11-27: qty 10

## 2014-11-27 MED ORDER — DEXAMETHASONE SODIUM PHOSPHATE 10 MG/ML IJ SOLN
INTRAMUSCULAR | Status: DC | PRN
  Administered 2014-11-27: .5 mL

## 2014-11-27 MED ORDER — ALFENTANIL 500 MCG/ML IJ INJ
INJECTION | INTRAMUSCULAR | Status: DC | PRN
  Administered 2014-11-27 (×2): 100 ug via INTRAVENOUS
  Administered 2014-11-27: 200 ug via INTRAVENOUS
  Administered 2014-11-27: 100 ug via INTRAVENOUS

## 2014-11-27 MED ORDER — PHENYLEPHRINE HCL 10 % OP SOLN
OPHTHALMIC | Status: AC
  Administered 2014-11-27: 1 [drp] via OPHTHALMIC
  Filled 2014-11-27: qty 5

## 2014-11-27 MED ORDER — CEFUROXIME OPHTHALMIC INJECTION 1 MG/0.1 ML
INJECTION | OPHTHALMIC | Status: AC
  Filled 2014-11-27: qty 0.1

## 2014-11-27 MED ORDER — NEOMYCIN-POLYMYXIN-DEXAMETH 0.1 % OP OINT
TOPICAL_OINTMENT | OPHTHALMIC | Status: DC | PRN
  Administered 2014-11-27: 1 via OPHTHALMIC

## 2014-11-27 MED ORDER — ATROPINE SULFATE 1 % OP SOLN
OPHTHALMIC | Status: DC | PRN
  Administered 2014-11-27: 2 [drp] via OPHTHALMIC

## 2014-11-27 MED ORDER — PHENYLEPHRINE HCL 10 % OP SOLN
1.0000 [drp] | OPHTHALMIC | Status: AC
  Administered 2014-11-27 (×3): 1 [drp] via OPHTHALMIC

## 2014-11-27 MED ORDER — TRIAMCINOLONE ACETONIDE 40 MG/ML IJ SUSP
INTRAMUSCULAR | Status: AC
  Filled 2014-11-27: qty 1

## 2014-11-27 MED ORDER — DEXTROSE 50 % IV SOLN
INTRAVENOUS | Status: AC
  Filled 2014-11-27: qty 50

## 2014-11-27 MED ORDER — NEOMYCIN-POLYMYXIN-DEXAMETH 3.5-10000-0.1 OP OINT
TOPICAL_OINTMENT | OPHTHALMIC | Status: AC
  Filled 2014-11-27: qty 3.5

## 2014-11-27 MED FILL — Hypromellose Ophth Gel 0.3%: OPHTHALMIC | Qty: 10 | Status: AC

## 2014-11-27 SURGICAL SUPPLY — 34 items
APPLICATOR COT TIP 3IN STERILE (MISCELLANEOUS) ×2
APPLICATOR COT TIP 3IN STRL (MISCELLANEOUS) ×2 IMPLANT
APPLICATOR COTTON TIP 6IN STRL (MISCELLANEOUS) ×16 IMPLANT
CANNULA SOFT TIP 25G (CANNULA) IMPLANT
CORD BIP STRL DISP 12FT (MISCELLANEOUS) ×4 IMPLANT
COVER LIGHT HANDLE STERIS (MISCELLANEOUS) ×4 IMPLANT
CUP MEDICINE 2OZ PLAST GRAD ST (MISCELLANEOUS) ×4 IMPLANT
ERASER HMR WETFIELD 18G (MISCELLANEOUS) ×4 IMPLANT
ERASER HMR WETFIELD 25G (MISCELLANEOUS) IMPLANT
FORCEPS GRIESH GRASP 25G (INSTRUMENTS) IMPLANT
FORCEPS GRIESH ILM PLUS 25G (INSTRUMENTS) IMPLANT
GLOVE BIO SURGEON STRL SZ8 (GLOVE) ×4 IMPLANT
GLOVE SURG LX 6.5 MICRO (GLOVE) ×2
GLOVE SURG LX STRL 6.5 MICRO (GLOVE) ×2 IMPLANT
GOWN STRL REUS W/ TWL LRG LVL3 (GOWN DISPOSABLE) ×4 IMPLANT
GOWN STRL REUS W/TWL LRG LVL3 (GOWN DISPOSABLE) ×4
LENS VITRECTOMY FLAT DISP (MISCELLANEOUS) ×4 IMPLANT
NDL SAFETY 25GX1.5 (NEEDLE) ×4 IMPLANT
NEEDLE HYPO 25GX1X1/2 BEV (NEEDLE) ×4 IMPLANT
PACK EYE AFTER SURG (MISCELLANEOUS) ×4 IMPLANT
PACK VITRECTOMY (MISCELLANEOUS) IMPLANT
PACK VITRECTOMY CASSETTE 25GA (MISCELLANEOUS) ×4 IMPLANT
PROBE LASER ILLUM FLEX CVD 25G (OPHTHALMIC) IMPLANT
SOL PREP PVP 2OZ (MISCELLANEOUS) ×4
SOLUTION PREP PVP 2OZ (MISCELLANEOUS) ×2 IMPLANT
STRAP SAFETY BODY (MISCELLANEOUS) ×4 IMPLANT
STRIP SILICONE 42 (MISCELLANEOUS) IMPLANT
STRIP SILICONE 70 (MISCELLANEOUS) IMPLANT
SUT ETHILON 5.0 S-24 (SUTURE) IMPLANT
SUT SILK 4 0 (SUTURE)
SUT SILK 4-0 18XBRD TIE 12 (SUTURE) IMPLANT
SUT VICRYL 7 0 TG140 8 (SUTURE) ×4 IMPLANT
SYR 3ML LL SCALE MARK (SYRINGE) ×4 IMPLANT
SYR TB 1ML LUER SLIP (SYRINGE) ×4 IMPLANT

## 2014-11-27 NOTE — Discharge Instructions (Addendum)
AMBULATORY SURGERY  °DISCHARGE INSTRUCTIONS ° ° °1) The drugs that you were given will stay in your system until tomorrow so for the next 24 hours you should not: ° °A) Drive an automobile °B) Make any legal decisions °C) Drink any alcoholic beverage ° ° °2) You may resume regular meals tomorrow.  Today it is better to start with liquids and gradually work up to solid foods. ° °You may eat anything you prefer, but it is better to start with liquids, then soup and crackers, and gradually work up to solid foods. ° ° °3) Please notify your doctor immediately if you have any unusual bleeding, trouble breathing, redness and pain at the surgery site, drainage, fever, or pain not relieved by medication. ° ° ° °4) Additional Instructions: ° ° ° ° ° ° ° °Please contact your physician with any problems or Same Day Surgery at [phone], Monday through Friday 6 am to 4 pm, or [place] at [facility] Main number at [phone].AMBULATORY SURGERY  °DISCHARGE INSTRUCTIONS ° ° °5) The drugs that you were given will stay in your system until tomorrow so for the next 24 hours you should not: ° °D) Drive an automobile °E) Make any legal decisions °F) Drink any alcoholic beverage ° ° °6) You may resume regular meals tomorrow.  Today it is better to start with liquids and gradually work up to solid foods. ° °You may eat anything you prefer, but it is better to start with liquids, then soup and crackers, and gradually work up to solid foods. ° ° °7) Please notify your doctor immediately if you have any unusual bleeding, trouble breathing, redness and pain at the surgery site, drainage, fever, or pain not relieved by medication. ° ° ° °8) Additional Instructions: ° ° ° ° ° ° ° °Please contact your physician with any problems or Same Day Surgery at [phone], Monday through Friday 6 am to 4 pm, or  at [place] Main number at [phone]. °

## 2014-11-27 NOTE — Op Note (Signed)
INDICATIONS & JUSTIFICATIONS FOR SURGERY: exposed scleral buckle left eye PREOPERATIVE DIAGNOSIS: Exposed scleral buckle left eye            POST OPERATIVE DIAGNOSIS: Exposed scleral buckle left eye                      OPERATION PERFORMED: Removal scleral buckle              ANESTHESIA: MAC with local   COMPLICATIONS: None.     BLOOD LOSS: Minimal.   SPECIMEN: None.   DESCRIPTION OF PROCEDURE: Patient was evaluated in the clinic for an exposed scleral buckle left eye.  There were no signs of infection on clinic evaluation, but the patient had a significant amount of irritation and granulation tissue.  Risks including redetachment, benefits, alternatives and complications were discussed, and patient elected to proceed with scleral buckle removal.  On the day of surgery patient and his parents were greeted in the preoperative holding area. Any questions were answered.  The left eye was marked and consents were reviewed. Patient was taken to the operating room in supine position.  The Left eye was then prepped and draped in the usual sterile fashion.  Monitored anesthesia care was then administered and 2 cc of lidocaine/marcaine/wydase mix was injected subconjunctivally in the area of the exposed buckle inferonasally.  The visible nylon suture was cut, and the silicone sleeve was released. The scleral buckle was then removed.  The suture was then shortened close to the globe and the area was inspected.  Hemostasis was achieved with cautery.  The small nub of granulation tissue was then excised. Vicryl 7.0 was used to close the conjunctiva with two interrupted sutures. Indirect illumination inspection of the periphery was performed. The retina appeared stable with no new subretinal fluid or breaks..   Neo/poly/dex ointment was then applied to the ocular surface and the patient was patched and shielded and taken to the recovery area in stable condition.

## 2014-11-27 NOTE — H&P (Signed)
.  Previous H&P scanned in reviewed, patient examined, and no interval changes.  Please see scanned record for complete information.  Please see scanned note from my office as well as note from his cardiologist.

## 2014-11-27 NOTE — Anesthesia Procedure Notes (Signed)
Procedure Name: MAC Performed by: Adien Kimmel Pre-anesthesia Checklist: Patient identified, Emergency Drugs available, Suction available, Patient being monitored and Timeout performed Oxygen Delivery Method: Nasal cannula       

## 2014-11-27 NOTE — Anesthesia Preprocedure Evaluation (Signed)
Anesthesia Evaluation  Patient identified by MRN, date of birth, ID band Patient awake    Reviewed: Allergy & Precautions, NPO status , Patient's Chart, lab work & pertinent test results  History of Anesthesia Complications (+) PONV  Airway Mallampati: II       Dental  (+) Teeth Intact   Pulmonary sleep apnea ,    Pulmonary exam normal       Cardiovascular hypertension, Pt. on medications Normal cardiovascular exam    Neuro/Psych Depression negative neurological ROS     GI/Hepatic negative GI ROS, Neg liver ROS,   Endo/Other  Morbid obesity  Renal/GU negative Renal ROS  negative genitourinary   Musculoskeletal  (+) Arthritis -,   Abdominal Normal abdominal exam  (+)   Peds negative pediatric ROS (+)  Hematology negative hematology ROS (+)   Anesthesia Other Findings   Reproductive/Obstetrics                             Anesthesia Physical Anesthesia Plan  ASA: III  Anesthesia Plan: MAC   Post-op Pain Management:    Induction: Intravenous  Airway Management Planned: Nasal Cannula  Additional Equipment:   Intra-op Plan:   Post-operative Plan:   Informed Consent: I have reviewed the patients History and Physical, chart, labs and discussed the procedure including the risks, benefits and alternatives for the proposed anesthesia with the patient or authorized representative who has indicated his/her understanding and acceptance.     Plan Discussed with: CRNA  Anesthesia Plan Comments:         Anesthesia Quick Evaluation

## 2014-11-27 NOTE — Anesthesia Postprocedure Evaluation (Signed)
  Anesthesia Post-op Note  Patient: Robert Stevenson  Procedure(s) Performed: Procedure(s): REMOVAL OF SCLERAL BUCKLE (Left)  Anesthesia type:MAC  Patient location: PACU  Post pain: Pain level controlled  Post assessment: Post-op Vital signs reviewed, Patient's Cardiovascular Status Stable, Respiratory Function Stable, Patent Airway and No signs of Nausea or vomiting  Post vital signs: Reviewed and stable  Last Vitals:  Filed Vitals:   11/27/14 0813  BP: 162/85  Pulse: 63  Temp: 36.3 C  Resp: 18    Level of consciousness: awake, alert  and patient cooperative  Complications: No apparent anesthesia complications

## 2014-11-27 NOTE — Transfer of Care (Signed)
Immediate Anesthesia Transfer of Care Note  Patient: Robert Stevenson  Procedure(s) Performed: Procedure(s): REMOVAL OF SCLERAL BUCKLE (Left)  Patient Location: PACU  Anesthesia Type:MAC  Level of Consciousness: awake, alert  and oriented  Airway & Oxygen Therapy: Patient Spontanous Breathing  Post-op Assessment: Report given to RN and Post -op Vital signs reviewed and stable  Post vital signs: Reviewed and stable  Last Vitals:  Filed Vitals:   11/27/14 0813  BP: 162/85  Pulse: 63  Temp: 36.3 C  Resp: 18    Complications: No apparent anesthesia complications

## 2015-01-28 ENCOUNTER — Telehealth: Payer: Self-pay | Admitting: Pulmonary Disease

## 2015-01-29 NOTE — Telephone Encounter (Signed)
Will call and schedule appointment with Dr. Vassie Loll in November once Dr. Vassie Loll has opened his November schedule. Attempted to contact patient to advise him, left message to call back. Set reminder to contact patient for appointment Can close encounter once patient is notified

## 2015-01-29 NOTE — Telephone Encounter (Signed)
Patient notified that we will schedule Consult in November. Patient is okay with this plan Reminder set to contact patient. Will contact patient to schedule appointment once schedule has been opened. Nothing further needed.

## 2015-02-24 ENCOUNTER — Telehealth: Payer: Self-pay | Admitting: *Deleted

## 2015-02-24 NOTE — Telephone Encounter (Signed)
Error

## 2015-03-06 ENCOUNTER — Institutional Professional Consult (permissible substitution): Payer: 59 | Admitting: Pulmonary Disease

## 2015-03-13 ENCOUNTER — Ambulatory Visit (INDEPENDENT_AMBULATORY_CARE_PROVIDER_SITE_OTHER): Payer: 59 | Admitting: Internal Medicine

## 2015-03-13 ENCOUNTER — Encounter: Payer: Self-pay | Admitting: Internal Medicine

## 2015-03-13 VITALS — BP 138/64 | HR 81 | Ht >= 80 in | Wt >= 6400 oz

## 2015-03-13 DIAGNOSIS — G4733 Obstructive sleep apnea (adult) (pediatric): Secondary | ICD-10-CM

## 2015-03-13 NOTE — Patient Instructions (Signed)
Please see patient coordinator before you leave today  to schedule equipment for cpap and download   Please schedule a follow up visit in  6 months but call sooner if needed to establish with a sleep specialist here

## 2015-03-13 NOTE — Assessment & Plan Note (Signed)
Body mass index is 46.35 kg/(m^2).  No results found for: TSH   Contributing to gerd tendency/ doe/reviewed the need and the process to achieve and maintain neg calorie balance > defer f/u primary care including intermittently monitoring thyroid status

## 2015-03-13 NOTE — Progress Notes (Signed)
Subjective:     Patient ID: Robert Stevenson, male   DOB: 19-Jan-1976,   MRN: [id number]  HPI   [age] yobm never smoker with [facility]SA dx [facility] at [facility] referred by Dr Leonette MostKalish 03/13/2015 for osa eval   03/13/2015 1st [facility] office visit/ Wert   Chief Complaint  Patient presents with  . Sleep Consult    Referred by Dr. Flonnie HailstoneKalish-has cpap,needs supplies.Uses nasal pillows,pr. is good.   sleeps about 6 hours and feels a little tired but no ha on CPAP around 8 cm but lots of shift work adding to his poor sleep quality/ quantity.  Epworth score 12/ never driving  No obvious day to day or daytime variability or assoc chronic cough or cp or chest tightness, subjective wheeze or overt sinus or hb symptoms. No unusual exp hx or h/o childhood pna/ asthma or knowledge of premature birth.  Sleeping ok without nocturnal  or early am exacerbation  of respiratory  c/o's or need for noct saba. Also denies any obvious fluctuation of symptoms with weather or environmental changes or other aggravating or alleviating factors except as outlined above   Current Medications, Allergies, Complete Past Medical History, Past Surgical History, Family History, and Social History were reviewed in [facility] electronic medical record.  ROS  The following are not active complaints unless bolded sore throat, dysphagia, dental problems, itching, sneezing,  nasal congestion or excess/ purulent secretions, ear ache,   fever, chills, sweats, unintended wt loss, classically pleuritic or exertional cp, hemoptysis,  orthopnea pnd or leg swelling, presyncope, palpitations, abdominal pain, anorexia, nausea, vomiting, diarrhea  or change in bowel or bladder habits, change in stools or urine, dysuria,hematuria,  rash, arthralgias, visual complaints, headache, numbness, weakness or ataxia or problems with walking or coordination,  change in mood/affect or memory.        Review of Systems     Objective:   Physical Exam    amb  obese bm nad  Wt Readings from Last 3 Encounters:  03/13/15 196.135 kg (432 lb 6.4 oz)  11/13/14 189.604 kg (418 lb)  06/14/14 185.068 kg (408 lb)    Vital signs reviewed   HEENT: nl dentition, turbinates, and oropharynx. Nl external ear canals without cough reflex Modified Mallampati Score = 1     NECK :  without JVD/Nodes/TM/ nl carotid upstrokes bilaterally   LUNGS: no acc muscle use, clear to A and P bilaterally without cough on insp or exp maneuvers   CV:  RRR  no s3 or murmur or increase in P2, no edema   ABD:  soft and nontender with nl excursion in the supine position. No bruits or organomegaly, bowel sounds nl  MS:  warm without deformities, calf tenderness, cyanosis or clubbing  SKIN: warm and dry without lesions    NEURO:  alert, approp, no deficits      Assessment:

## 2015-03-13 NOTE — Assessment & Plan Note (Signed)
Dx 2003 [facility] and [facility] Established [place] 03/13/2015 > rec download and f/u with sleep medicine @ 6 m  I had an extended discussion with the patient reviewing all relevant studies completed to date and  lasting 25 minutes of a 40 minute visit    Each maintenance medication was reviewed in detail including most importantly the difference between maintenance and prns and under what circumstances the prns are to be triggered using an action plan format that is not reflected in the computer generated alphabetically organized AVS.    Please see instructions for details which were reviewed in writing and the patient given a copy highlighting the part that I personally wrote and discussed at today's ov.

## 2015-03-28 ENCOUNTER — Institutional Professional Consult (permissible substitution): Payer: 59 | Admitting: Internal Medicine

## 2015-05-07 DIAGNOSIS — F329 Major depressive disorder, single episode, unspecified: Secondary | ICD-10-CM | POA: Diagnosis not present

## 2015-05-19 DIAGNOSIS — F329 Major depressive disorder, single episode, unspecified: Secondary | ICD-10-CM | POA: Diagnosis not present

## 2015-05-22 MED FILL — LOSARTAN POTASSIUM 100 MG T: 100 | 90 days supply | Qty: 90 | Fill #1 | Status: TO

## 2015-06-02 DIAGNOSIS — F329 Major depressive disorder, single episode, unspecified: Secondary | ICD-10-CM | POA: Diagnosis not present

## 2015-06-04 DIAGNOSIS — R0789 Other chest pain: Secondary | ICD-10-CM | POA: Diagnosis not present

## 2015-06-04 DIAGNOSIS — I10 Essential (primary) hypertension: Secondary | ICD-10-CM | POA: Diagnosis not present

## 2015-06-04 MED FILL — OMEPRAZOLE DR 20 MG CAPSULE: 20 | 30 days supply | Qty: 30 | Fill #0 | Status: TO

## 2015-06-10 DIAGNOSIS — G4733 Obstructive sleep apnea (adult) (pediatric): Secondary | ICD-10-CM | POA: Diagnosis not present

## 2015-06-13 DIAGNOSIS — Q8740 Marfan's syndrome, unspecified: Secondary | ICD-10-CM | POA: Diagnosis not present

## 2015-06-13 DIAGNOSIS — Z952 Presence of prosthetic heart valve: Secondary | ICD-10-CM | POA: Diagnosis not present

## 2015-06-13 DIAGNOSIS — Z9889 Other specified postprocedural states: Secondary | ICD-10-CM | POA: Diagnosis not present

## 2015-06-13 DIAGNOSIS — Z8679 Personal history of other diseases of the circulatory system: Secondary | ICD-10-CM | POA: Diagnosis not present

## 2015-06-16 MED FILL — METOPROLOL SUCC ER 25 MG TA: 25 | 90 days supply | Qty: 90 | Fill #0

## 2015-06-20 DIAGNOSIS — F329 Major depressive disorder, single episode, unspecified: Secondary | ICD-10-CM | POA: Diagnosis not present

## 2015-06-30 ENCOUNTER — Ambulatory Visit (INDEPENDENT_AMBULATORY_CARE_PROVIDER_SITE_OTHER): Payer: 59 | Admitting: Sports Medicine

## 2015-06-30 DIAGNOSIS — M170 Bilateral primary osteoarthritis of knee: Secondary | ICD-10-CM

## 2015-06-30 NOTE — Assessment & Plan Note (Signed)
Did extremely well with previous left knee injection in February 2016, repeat injection today.

## 2015-06-30 NOTE — Progress Notes (Signed)
  Subjective:    CC: Left knee pain  HPI: This is a pleasant 40 year old male with Marfan syndrome, he has bilateral knee osteoarthritis, left worse than right, the left knee was injected in February 2016, here fantastic response and is here with a recurrence of pain and desires repeat interventional treatment, pain is moderate, worsening, localized at the medial and lateral joint lines without radiation.  Past medical history, Surgical history, Family history not pertinant except as noted below, Social history, Allergies, and medications have been entered into the medical record, reviewed, and no changes needed.   Review of Systems: No fevers, chills, night sweats, weight loss, chest pain, or shortness of breath.   Objective:    General: Well Developed, well nourished, and in no acute distress.  Neuro: Alert and oriented x3, extra-ocular muscles intact, sensation grossly intact.  HEENT: Normocephalic, atraumatic, pupils equal round reactive to light, neck supple, no masses, no lymphadenopathy, thyroid nonpalpable.  Skin: Warm and dry, no rashes. Cardiac: Regular rate and rhythm, no murmurs rubs or gallops, no lower extremity edema.  Respiratory: Clear to auscultation bilaterally. Not using accessory muscles, speaking in full sentences.  Procedure: Real-time Ultrasound Guided Injection of  left knee Device: GE Logiq E  Verbal informed consent obtained.  Time-out conducted.  Noted no overlying erythema, induration, or other signs of local infection.  Skin prepped in a sterile fashion.  Local anesthesia: Topical Ethyl chloride.  With sterile technique and under real time ultrasound guidance:  2 mL kenalog 40, 2 mL lidocaine, 2 mL Marcaine injected easily into the suprapatellar recess. Completed without difficulty  Pain immediately resolved suggesting accurate placement of the medication.  Advised to call if fevers/chills, erythema, induration, drainage, or persistent bleeding.  Images  permanently stored and available for review in the ultrasound unit.  Impression: Technically successful ultrasound guided injection.  Impression and Recommendations:

## 2015-07-04 DIAGNOSIS — F329 Major depressive disorder, single episode, unspecified: Secondary | ICD-10-CM | POA: Diagnosis not present

## 2015-07-07 MED FILL — BUPROPION HCL XL 150 MG TAB: 150 | 90 days supply | Qty: 270 | Fill #0

## 2015-07-07 MED FILL — ATORVASTATIN 10 MG TABLET: 10 | 90 days supply | Qty: 90 | Fill #0

## 2015-07-10 DIAGNOSIS — H5989 Other postprocedural complications and disorders of eye and adnexa, not elsewhere classified: Secondary | ICD-10-CM | POA: Diagnosis not present

## 2015-07-29 DIAGNOSIS — Z9889 Other specified postprocedural states: Secondary | ICD-10-CM | POA: Diagnosis not present

## 2015-07-29 DIAGNOSIS — Q8740 Marfan's syndrome, unspecified: Secondary | ICD-10-CM | POA: Diagnosis not present

## 2015-07-29 DIAGNOSIS — G4733 Obstructive sleep apnea (adult) (pediatric): Secondary | ICD-10-CM | POA: Diagnosis not present

## 2015-07-29 MED FILL — PHENTERMINE 15 MG CAPSULE: 15 | 30 days supply | Qty: 30 | Fill #0

## 2015-08-04 DIAGNOSIS — F419 Anxiety disorder, unspecified: Secondary | ICD-10-CM | POA: Insufficient documentation

## 2015-08-04 DIAGNOSIS — F418 Other specified anxiety disorders: Secondary | ICD-10-CM | POA: Diagnosis not present

## 2015-08-04 DIAGNOSIS — Z9889 Other specified postprocedural states: Secondary | ICD-10-CM | POA: Insufficient documentation

## 2015-08-04 DIAGNOSIS — E785 Hyperlipidemia, unspecified: Secondary | ICD-10-CM | POA: Diagnosis not present

## 2015-08-04 DIAGNOSIS — F3341 Major depressive disorder, recurrent, in partial remission: Secondary | ICD-10-CM | POA: Insufficient documentation

## 2015-08-04 MED FILL — ESCITALOPRAM 10 MG TABLET: 10 | 30 days supply | Qty: 30 | Fill #0

## 2015-08-11 DIAGNOSIS — F329 Major depressive disorder, single episode, unspecified: Secondary | ICD-10-CM | POA: Diagnosis not present

## 2015-08-25 DIAGNOSIS — F329 Major depressive disorder, single episode, unspecified: Secondary | ICD-10-CM | POA: Diagnosis not present

## 2015-09-08 DIAGNOSIS — G4733 Obstructive sleep apnea (adult) (pediatric): Secondary | ICD-10-CM | POA: Diagnosis not present

## 2015-09-08 DIAGNOSIS — Q8740 Marfan's syndrome, unspecified: Secondary | ICD-10-CM | POA: Diagnosis not present

## 2015-09-08 MED FILL — PHENTERMINE 15 MG CAPSULE: 15 | 30 days supply | Qty: 30 | Fill #1

## 2015-09-08 MED FILL — LOSARTAN POTASSIUM 100 MG T: 100 | 90 days supply | Qty: 90 | Fill #0

## 2015-09-08 MED FILL — ESCITALOPRAM 10 MG TABLET: 10 | 30 days supply | Qty: 30 | Fill #0

## 2015-09-10 DIAGNOSIS — G4733 Obstructive sleep apnea (adult) (pediatric): Secondary | ICD-10-CM | POA: Diagnosis not present

## 2015-09-10 DIAGNOSIS — F329 Major depressive disorder, single episode, unspecified: Secondary | ICD-10-CM | POA: Diagnosis not present

## 2015-09-12 MED FILL — AMOXICILLIN 500 MG CAPSULE: 500 | 1 days supply | Qty: 4 | Fill #0

## 2015-09-18 ENCOUNTER — Encounter: Payer: Self-pay | Admitting: Pulmonary Disease

## 2015-09-18 ENCOUNTER — Ambulatory Visit (INDEPENDENT_AMBULATORY_CARE_PROVIDER_SITE_OTHER): Payer: 59 | Admitting: Pulmonary Disease

## 2015-09-18 VITALS — BP 137/84 | HR 72 | Ht >= 80 in | Wt >= 6400 oz

## 2015-09-18 DIAGNOSIS — G4726 Circadian rhythm sleep disorder, shift work type: Secondary | ICD-10-CM

## 2015-09-18 DIAGNOSIS — G4733 Obstructive sleep apnea (adult) (pediatric): Secondary | ICD-10-CM

## 2015-09-18 NOTE — Patient Instructions (Signed)
Obtain sleep study from [facility] Obtain download on current machine CPAP supplies will be renewed for a year

## 2015-09-18 NOTE — Assessment & Plan Note (Signed)
CPAP supplies will be renewed for a year We will obtain her sleep studies from [facility] We will obtain a CPAP download and ensure that settings are okay  Weight loss encouraged, compliance with goal of at least 4-6 hrs every night is the expectation. Advised against medications with sedative side effects Cautioned against driving when sleepy - understanding that sleepiness will vary on a day to day basis

## 2015-09-18 NOTE — Progress Notes (Signed)
   Subjective:    Patient ID: Robert Stevenson, male    DOB: Oct 10, 1975, 40 y.o.   MRN: [id number]  HPI  Chief Complaint  Patient presents with  . Sleep Consult    Referred by Dr. Sherene SiresWert for maintaining CPAP supplies.  Wears CPAP every night. Approx 6 hours nightly.  Epworth Score: 11   39 yobm never smoker with Marfan syndromes and OSA dx 2003 at [facility]  He had aortic root replacement in 2012 and numerous eye surgeries lasting 2016 He had a follow-up sleep study at [facility] in 2012 and has been maintained on CPAP with nasal pillows with good results since then. 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 He reports inability to sleep at night when he works days on weekends    Past Medical History  Diagnosis Date  . Hypertension   . Hyperlipidemia   . Obesity   . Sleep apnea   . Marfan syndrome   . Complication of anesthesia   . PONV (postoperative nausea and vomiting)   . Depression     Review of Systems neg for any significant sore throat, dysphagia, itching, sneezing, nasal congestion or excess/ purulent secretions, fever, chills, sweats, unintended wt loss, pleuritic or exertional cp, hempoptysis, orthopnea pnd or change in chronic leg swelling. Also denies presyncope, palpitations, heartburn, abdominal pain, nausea, vomiting, diarrhea or change in bowel or urinary habits, dysuria,hematuria, rash, arthralgias, visual complaints, headache, numbness weakness or ataxia.     Objective:   Physical Exam  Gen. Pleasant, obese, in no distress, normal affect ENT - High arch palate, no post nasal drip, class 2-3 airway Neck: No JVD, no thyromegaly, no carotid bruits Lungs: no use of accessory muscles, no dullness to percussion, decreased without rales or rhonchi  Cardiovascular: Rhythm regular, heart sounds  normal, no murmurs or gallops, no peripheral edema Abdomen: soft and non-tender, no hepatosplenomegaly,  BS normal. Musculoskeletal: Marfan features with long fingers, no cyanosis or clubbing Neuro:  alert, non focal, no tremors       Assessment & Plan:

## 2015-09-19 DIAGNOSIS — G4726 Circadian rhythm sleep disorder, shift work type: Secondary | ICD-10-CM | POA: Insufficient documentation

## 2015-09-19 NOTE — Assessment & Plan Note (Signed)
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

## 2015-09-22 DIAGNOSIS — E785 Hyperlipidemia, unspecified: Secondary | ICD-10-CM | POA: Diagnosis not present

## 2015-09-26 MED FILL — AMOXICILLIN 500 MG CAPSULE: 500 | 1 days supply | Qty: 4 | Fill #1

## 2015-09-26 MED FILL — ATORVASTATIN 10 MG TABLET: 10 | 90 days supply | Qty: 90 | Fill #0

## 2015-10-01 DIAGNOSIS — F3341 Major depressive disorder, recurrent, in partial remission: Secondary | ICD-10-CM | POA: Diagnosis not present

## 2015-10-01 DIAGNOSIS — F418 Other specified anxiety disorders: Secondary | ICD-10-CM | POA: Diagnosis not present

## 2015-10-06 DIAGNOSIS — F329 Major depressive disorder, single episode, unspecified: Secondary | ICD-10-CM | POA: Diagnosis not present

## 2015-10-13 MED FILL — AMOXICILLIN 500 MG CAPSULE: 500 | 1 days supply | Qty: 4 | Fill #2

## 2015-10-13 MED FILL — METOPROLOL SUCC ER 25 MG TA: 25 | 90 days supply | Qty: 90 | Fill #1

## 2015-10-17 DIAGNOSIS — Q8740 Marfan's syndrome, unspecified: Secondary | ICD-10-CM | POA: Diagnosis not present

## 2015-10-17 DIAGNOSIS — G4733 Obstructive sleep apnea (adult) (pediatric): Secondary | ICD-10-CM | POA: Diagnosis not present

## 2015-10-17 MED FILL — PHENTERMINE 37.5 MG TABLET: 37.5 | 30 days supply | Qty: 30 | Fill #0

## 2015-10-28 DIAGNOSIS — F329 Major depressive disorder, single episode, unspecified: Secondary | ICD-10-CM | POA: Diagnosis not present

## 2015-10-31 MED FILL — ESCITALOPRAM 10 MG TABLET: 10 | 90 days supply | Qty: 90 | Fill #0

## 2015-11-03 DIAGNOSIS — F329 Major depressive disorder, single episode, unspecified: Secondary | ICD-10-CM | POA: Diagnosis not present

## 2015-11-10 MED FILL — BUPROPION HCL XL 150 MG TAB: 150 | 90 days supply | Qty: 270 | Fill #0

## 2015-11-17 MED FILL — OMEPRAZOLE DR 20 MG CAPSULE: 20 | 30 days supply | Qty: 30 | Fill #0

## 2015-12-15 DIAGNOSIS — Q8740 Marfan's syndrome, unspecified: Secondary | ICD-10-CM | POA: Diagnosis not present

## 2015-12-15 DIAGNOSIS — G4733 Obstructive sleep apnea (adult) (pediatric): Secondary | ICD-10-CM | POA: Diagnosis not present

## 2015-12-22 MED FILL — OMEPRAZOLE DR 20 MG CAPSULE: 20 | 30 days supply | Qty: 30 | Fill #1

## 2015-12-22 MED FILL — LOSARTAN POTASSIUM 100 MG T: 100 | 90 days supply | Qty: 90 | Fill #1

## 2016-01-07 DIAGNOSIS — H33013 Retinal detachment with single break, bilateral: Secondary | ICD-10-CM | POA: Diagnosis not present

## 2016-01-08 ENCOUNTER — Telehealth: Payer: Self-pay | Admitting: Sports Medicine

## 2016-01-08 ENCOUNTER — Ambulatory Visit (INDEPENDENT_AMBULATORY_CARE_PROVIDER_SITE_OTHER): Payer: 59 | Admitting: Sports Medicine

## 2016-01-08 ENCOUNTER — Encounter: Payer: Self-pay | Admitting: Sports Medicine

## 2016-01-08 DIAGNOSIS — M170 Bilateral primary osteoarthritis of knee: Secondary | ICD-10-CM

## 2016-01-08 MED ORDER — CELECOXIB 200 MG PO CAPS: ORAL_CAPSULE | ORAL | 2 refills | Status: DC

## 2016-01-08 NOTE — Telephone Encounter (Signed)
Submitted for approval on Orthovisc. Awaiting confirmation.  

## 2016-01-08 NOTE — Telephone Encounter (Signed)
-----   Message from Monica Bectonhomas J Thekkekandam, MD sent at 01/08/2016  8:42 AM EDT ----- Orthovisc approval por favor.  Bilateral knee OA, approval only for the left knee though, x-ray confirmed, failed injections and NSAIDs. Preesh Boogs

## 2016-01-08 NOTE — Progress Notes (Signed)
  Subjective:    CC: Follow-up  HPI: This is a pleasant 40 year old male, he has known bilateral knee osteoarthritis, we injected his left knee in February, previous injection provided several months of relief, the most recent one only provided 10 days. He is agreeable at this point proceed with viscous supplementation. Has no pain in the right knee, the pain is moderate, persistent, localized under the patella without radiation.  Past medical history, Surgical history, Family history not pertinant except as noted below, Social history, Allergies, and medications have been entered into the medical record, reviewed, and no changes needed.   Review of Systems: No fevers, chills, night sweats, weight loss, chest pain, or shortness of breath.   Objective:    General: Well Developed, well nourished, and in no acute distress.  Neuro: Alert and oriented x3, extra-ocular muscles intact, sensation grossly intact.  HEENT: Normocephalic, atraumatic, pupils equal round reactive to light, neck supple, no masses, no lymphadenopathy, thyroid nonpalpable.  Skin: Warm and dry, no rashes. Cardiac: Regular rate and rhythm, no murmurs rubs or gallops, no lower extremity edema.  Respiratory: Clear to auscultation bilaterally. Not using accessory muscles, speaking in full sentences. Left Knee: Normal to inspection with no erythema or effusion or obvious bony abnormalities. Tender to palpation at the lateral patellar facet and lateral joint line. ROM normal in flexion and extension and lower leg rotation. Ligaments with solid consistent endpoints including ACL, PCL, LCL, MCL. Negative Mcmurray's and provocative meniscal tests. Non painful patellar compression. Patellar and quadriceps tendons unremarkable. Hamstring and quadriceps strength is normal.  Impression and Recommendations:    Osteoarthritis of both knees Right knee is okay, pain in the left knee despite injections and NSAIDs. Orthovisc approval  and switching him to Celebrex. Return to start Orthovisc injections. I do suspect he is heading toward knee arthroplasty so we are going to give him also in with our orthopedist.  I spent 25 minutes with this patient, greater than 50% was face-to-face time counseling regarding the above diagnoses

## 2016-01-08 NOTE — Assessment & Plan Note (Signed)
Right knee is okay, pain in the left knee despite injections and NSAIDs. Orthovisc approval and switching him to Celebrex. Return to start Orthovisc injections. I do suspect he is heading toward knee arthroplasty so we are going to give him also in with our orthopedist.

## 2016-01-12 DIAGNOSIS — M2031 Hallux varus (acquired), right foot: Secondary | ICD-10-CM | POA: Diagnosis not present

## 2016-01-12 DIAGNOSIS — R0789 Other chest pain: Secondary | ICD-10-CM | POA: Diagnosis not present

## 2016-01-12 NOTE — Telephone Encounter (Signed)
Received the benefits quote from [facility] investigation, incorrect insurance was ran as primary. resubmitting for accurate benefit quote. Pt advised of delay.

## 2016-01-13 NOTE — Telephone Encounter (Signed)
Received the following updated information from [facility] benefits investigation:  Primary insurance: Patient has a PPO plan with an effective date of 05/04/2015. [id number] is covered at 100% & [id number] is covered at 100% of the contracted rate when performed in an office setting. A copay of $50.00 applies whether or not a specialist office is billed. if out of pocket is met, copay will no longer apply. REF: [id number]   Secondary insurance: Patient has Open Access [facility] Plan with an Effective date of 02/02/2015. [id number] is covered at 80% & [id number] is covered at 80% office visits are covered at 80% of the contracted rate when performed in an office setting. *Deductible must be met for coverage to apply. If out of pocket is met, coverage goes to 100%.  Left VM for Pt advising of coverage details, callback provided for scheduling.

## 2016-01-15 DIAGNOSIS — Z9889 Other specified postprocedural states: Secondary | ICD-10-CM | POA: Diagnosis not present

## 2016-01-15 DIAGNOSIS — Q8740 Marfan's syndrome, unspecified: Secondary | ICD-10-CM | POA: Diagnosis not present

## 2016-01-20 ENCOUNTER — Ambulatory Visit (INDEPENDENT_AMBULATORY_CARE_PROVIDER_SITE_OTHER): Payer: 59 | Admitting: Sports Medicine

## 2016-01-20 DIAGNOSIS — M170 Bilateral primary osteoarthritis of knee: Secondary | ICD-10-CM | POA: Diagnosis not present

## 2016-01-20 MED FILL — CELECOXIB 200 MG CAPSULE: 200 | 30 days supply | Qty: 60 | Fill #0

## 2016-01-20 NOTE — Progress Notes (Signed)

## 2016-01-20 NOTE — Assessment & Plan Note (Signed)
Right knee is okay, Orthovisc injection #1 into the left knee. Return in one week for injection #2 into the left knee.

## 2016-01-22 DIAGNOSIS — M25562 Pain in left knee: Secondary | ICD-10-CM | POA: Diagnosis not present

## 2016-01-23 ENCOUNTER — Other Ambulatory Visit (HOSPITAL_COMMUNITY): Payer: Self-pay | Admitting: Orthopedic Surgery

## 2016-01-23 DIAGNOSIS — M25562 Pain in left knee: Secondary | ICD-10-CM

## 2016-01-26 DIAGNOSIS — F329 Major depressive disorder, single episode, unspecified: Secondary | ICD-10-CM | POA: Diagnosis not present

## 2016-01-26 MED FILL — BELVIQ XR 20 MG TABLET: 20 | 30 days supply | Qty: 30 | Fill #0

## 2016-01-27 ENCOUNTER — Encounter: Payer: Self-pay | Admitting: Sports Medicine

## 2016-01-27 ENCOUNTER — Ambulatory Visit (INDEPENDENT_AMBULATORY_CARE_PROVIDER_SITE_OTHER): Payer: 59 | Admitting: Sports Medicine

## 2016-01-27 VITALS — BP 155/71 | HR 76 | Wt >= 6400 oz

## 2016-01-27 DIAGNOSIS — M170 Bilateral primary osteoarthritis of knee: Secondary | ICD-10-CM

## 2016-01-27 NOTE — Progress Notes (Signed)

## 2016-01-27 NOTE — Assessment & Plan Note (Signed)
Injection #2 of 4, return in one week for #3

## 2016-02-02 ENCOUNTER — Ambulatory Visit (HOSPITAL_COMMUNITY)
Admission: RE | Admit: 2016-02-02 | Discharge: 2016-02-02 | Disposition: A | Discharge status: Home / Self Care | Payer: 59 | Source: Ambulatory Visit | Attending: Orthopedic Surgery | Admitting: Orthopedic Surgery

## 2016-02-02 DIAGNOSIS — M25562 Pain in left knee: Secondary | ICD-10-CM | POA: Diagnosis not present

## 2016-02-02 DIAGNOSIS — M1712 Unilateral primary osteoarthritis, left knee: Secondary | ICD-10-CM | POA: Insufficient documentation

## 2016-02-02 DIAGNOSIS — M659 Synovitis and tenosynovitis, unspecified: Secondary | ICD-10-CM | POA: Insufficient documentation

## 2016-02-02 DIAGNOSIS — M25462 Effusion, left knee: Secondary | ICD-10-CM | POA: Diagnosis not present

## 2016-02-02 DIAGNOSIS — X58XXXA Exposure to other specified factors, initial encounter: Secondary | ICD-10-CM | POA: Insufficient documentation

## 2016-02-02 DIAGNOSIS — S83272A Complex tear of lateral meniscus, current injury, left knee, initial encounter: Secondary | ICD-10-CM | POA: Diagnosis not present

## 2016-02-03 ENCOUNTER — Ambulatory Visit (INDEPENDENT_AMBULATORY_CARE_PROVIDER_SITE_OTHER): Payer: 59 | Admitting: Sports Medicine

## 2016-02-03 DIAGNOSIS — M170 Bilateral primary osteoarthritis of knee: Secondary | ICD-10-CM | POA: Diagnosis not present

## 2016-02-03 NOTE — Assessment & Plan Note (Signed)
OrthoVisc number 3 left knee. Return in one week for #4

## 2016-02-03 NOTE — Progress Notes (Signed)

## 2016-02-09 MED FILL — METOPROLOL SUCC ER 25 MG TA: 25 | 90 days supply | Qty: 90 | Fill #2

## 2016-02-09 MED FILL — ATORVASTATIN 10 MG TABLET: 10 | 30 days supply | Qty: 30 | Fill #0

## 2016-02-10 ENCOUNTER — Ambulatory Visit (INDEPENDENT_AMBULATORY_CARE_PROVIDER_SITE_OTHER): Payer: 59 | Admitting: Sports Medicine

## 2016-02-10 DIAGNOSIS — M170 Bilateral primary osteoarthritis of knee: Secondary | ICD-10-CM

## 2016-02-10 NOTE — Assessment & Plan Note (Signed)
Orthovisc injection #4 as above.  Return as needed. He is in discussion with Dr. Luiz BlareGraves regarding the arthroplasty.

## 2016-02-10 NOTE — Progress Notes (Signed)

## 2016-02-16 DIAGNOSIS — F329 Major depressive disorder, single episode, unspecified: Secondary | ICD-10-CM | POA: Diagnosis not present

## 2016-02-17 DIAGNOSIS — M25562 Pain in left knee: Secondary | ICD-10-CM | POA: Diagnosis not present

## 2016-02-27 MED FILL — BELVIQ XR 20 MG TABLET: 20 | 30 days supply | Qty: 30 | Fill #1

## 2016-02-27 MED FILL — ESCITALOPRAM 10 MG TABLET: 10 | 90 days supply | Qty: 90 | Fill #1

## 2016-03-01 DIAGNOSIS — M2031 Hallux varus (acquired), right foot: Secondary | ICD-10-CM | POA: Diagnosis not present

## 2016-03-08 DIAGNOSIS — M2041 Other hammer toe(s) (acquired), right foot: Secondary | ICD-10-CM | POA: Diagnosis not present

## 2016-03-08 DIAGNOSIS — R0689 Other abnormalities of breathing: Secondary | ICD-10-CM | POA: Diagnosis not present

## 2016-03-08 DIAGNOSIS — Q8740 Marfan's syndrome, unspecified: Secondary | ICD-10-CM | POA: Diagnosis not present

## 2016-03-08 DIAGNOSIS — M2031 Hallux varus (acquired), right foot: Secondary | ICD-10-CM | POA: Diagnosis not present

## 2016-03-08 DIAGNOSIS — M2011 Hallux valgus (acquired), right foot: Secondary | ICD-10-CM | POA: Diagnosis not present

## 2016-03-08 DIAGNOSIS — M2040 Other hammer toe(s) (acquired), unspecified foot: Secondary | ICD-10-CM | POA: Diagnosis not present

## 2016-03-08 DIAGNOSIS — G4733 Obstructive sleep apnea (adult) (pediatric): Secondary | ICD-10-CM | POA: Diagnosis not present

## 2016-03-08 MED FILL — HYDROCODON-APAP 5-325: 5-325 | 4 days supply | Qty: 28 | Fill #0

## 2016-03-09 MED FILL — BUPROPION HCL XL 150 MG TAB: 150 | 90 days supply | Qty: 270 | Fill #1

## 2016-03-23 MED FILL — CELECOXIB 200 MG CAPSULE: 200 | 30 days supply | Qty: 60 | Fill #1

## 2016-03-29 MED FILL — ATORVASTATIN 10 MG TABLET: 10 | 30 days supply | Qty: 30 | Fill #0

## 2016-04-05 DIAGNOSIS — M2031 Hallux varus (acquired), right foot: Secondary | ICD-10-CM | POA: Diagnosis not present

## 2016-04-21 DIAGNOSIS — Z9889 Other specified postprocedural states: Secondary | ICD-10-CM | POA: Diagnosis not present

## 2016-04-21 DIAGNOSIS — Q8740 Marfan's syndrome, unspecified: Secondary | ICD-10-CM | POA: Diagnosis not present

## 2016-04-21 DIAGNOSIS — E785 Hyperlipidemia, unspecified: Secondary | ICD-10-CM | POA: Diagnosis not present

## 2016-04-21 DIAGNOSIS — R030 Elevated blood-pressure reading, without diagnosis of hypertension: Secondary | ICD-10-CM | POA: Diagnosis not present

## 2016-04-21 MED FILL — LOSARTAN POTASSIUM 100 MG T: 100 | 90 days supply | Qty: 90 | Fill #0

## 2016-04-21 MED FILL — PHENTERMINE 37.5 MG CAPSULE: 37.5 | 30 days supply | Qty: 30 | Fill #0

## 2016-05-05 DIAGNOSIS — F329 Major depressive disorder, single episode, unspecified: Secondary | ICD-10-CM | POA: Diagnosis not present

## 2016-05-24 DIAGNOSIS — F329 Major depressive disorder, single episode, unspecified: Secondary | ICD-10-CM | POA: Diagnosis not present

## 2016-06-04 DIAGNOSIS — Z9889 Other specified postprocedural states: Secondary | ICD-10-CM | POA: Diagnosis not present

## 2016-06-04 DIAGNOSIS — R030 Elevated blood-pressure reading, without diagnosis of hypertension: Secondary | ICD-10-CM | POA: Diagnosis not present

## 2016-06-04 DIAGNOSIS — Q8740 Marfan's syndrome, unspecified: Secondary | ICD-10-CM | POA: Diagnosis not present

## 2016-06-04 DIAGNOSIS — E785 Hyperlipidemia, unspecified: Secondary | ICD-10-CM | POA: Diagnosis not present

## 2016-06-04 MED FILL — ATORVASTATIN 20 MG TABLET: 20 | 90 days supply | Qty: 90 | Fill #0

## 2016-06-07 DIAGNOSIS — F329 Major depressive disorder, single episode, unspecified: Secondary | ICD-10-CM | POA: Diagnosis not present

## 2016-06-25 DIAGNOSIS — Q8740 Marfan's syndrome, unspecified: Secondary | ICD-10-CM | POA: Diagnosis not present

## 2016-06-28 DIAGNOSIS — F329 Major depressive disorder, single episode, unspecified: Secondary | ICD-10-CM | POA: Diagnosis not present

## 2016-06-30 ENCOUNTER — Other Ambulatory Visit (HOSPITAL_COMMUNITY): Payer: Self-pay | Admitting: General Surgery

## 2016-07-07 ENCOUNTER — Ambulatory Visit (HOSPITAL_COMMUNITY): Payer: 59

## 2016-07-07 ENCOUNTER — Other Ambulatory Visit (HOSPITAL_COMMUNITY): Payer: 59

## 2016-07-09 MED FILL — ESCITALOPRAM 10 MG TABLET: 10 | 18 days supply | Qty: 18 | Fill #0

## 2016-07-12 ENCOUNTER — Encounter: Payer: Self-pay | Admitting: Skilled Nursing Facility1

## 2016-07-12 ENCOUNTER — Encounter: Payer: 59 | Attending: General Surgery | Admitting: Skilled Nursing Facility1

## 2016-07-12 DIAGNOSIS — Z713 Dietary counseling and surveillance: Secondary | ICD-10-CM | POA: Insufficient documentation

## 2016-07-12 DIAGNOSIS — Z6841 Body Mass Index (BMI) 40.0 and over, adult: Secondary | ICD-10-CM | POA: Insufficient documentation

## 2016-07-12 DIAGNOSIS — E669 Obesity, unspecified: Secondary | ICD-10-CM | POA: Insufficient documentation

## 2016-07-12 NOTE — Patient Instructions (Signed)
Follow Pre-Op Goals Try Protein Shakes Call [facility] at [phone] when surgery is scheduled to enroll in Pre-Op Class  Things to remember:  Please always be honest with us. We want to support you!  If you have any questions or concerns in between appointments, please call or email   The diet after surgery will be high protein and low in carbohydrate.  Vitamins and calcium need to be taken for the rest of your life.  Feel free to include support people in any classes or appointments.   Supplement recommendations:  Before Surgery   1 Complete Multivitamin with Iron  3000 IU Vitamin D3  After Surgery   2 Chewable Multivitamins  **Best Choice - Bariatric Advantage Advanced Multi EA      3 Chewable Calcium (500 mg each, total 1200-1500 mg per day)  **Best Choice - Celebrate, Bariatric Advantage, or Wellesse  Other Options:  2 Celebrate MultiComplete with 18 mg Iron (this provides 6000 IU of  Vitamin D3)  4 Celebrate Essential Multi 2 in 1 (has calcium) + 18-60 mg separate  iron  Vitamins and Calcium are available at:   [facility]   [address], [place], [place] [address]   [email or web address]  [email or web address]  [email or web address]    

## 2016-07-12 NOTE — Progress Notes (Signed)
Pre-Op Assessment Visit:  Pre-Operative Sleeve Gastrectomy Surgery Patient was seen on 07/12/2016 for Pre-Operative Nutrition Assessment. Assessment and [facility] approval [facility] coordinator on 07/12/2016.   Start weight at [facility]: 471 BMI: 50.47  24 hr Dietary Recall: First Meal: bacon sausage french toast Snack: fruit----turkey jerky  Second Meal: baked chicekn and vegetable and rice Snack: cobbler Third Meal: fried chicken and ribs and cake Snack:  Grilled chicken sweet potatoes and spinach Beverages: sweet tea, flavored water, hot tea  Encouraged to engage in 50 minutes of moderate physical activity including cardiovascular and weight baring weekly  Handouts given during visit include:  . Pre-Op Goals . Bariatric Surgery Protein Shakes During the appointment today the following Pre-Op Goals were reviewed with the patient: . Maintain or lose weight as instructed by your surgeon . Make healthy food choices . Begin to limit portion sizes . Limited concentrated sugars and fried foods . Keep fat/sugar in the single digits per serving on         food labels . Practice CHEWING your food  (aim for 30 chews per bite or until applesauce consistency) . Practice not drinking 15 minutes before, during, and 30 minutes after each meal/snack . Avoid all carbonated beverages  . Avoid/limit caffeinated beverages  . Avoid all sugar-sweetened beverages . Consume 3 meals per day; eat every 3-5 hours . Make a list of non-food related activities . Aim for 64-100 ounces of FLUID daily  . Aim for at least 60-80 grams of PROTEIN daily . Look for a liquid protein source that contain ?15 g protein and ?5 g carbohydrate  (ex: shakes, drinks, shots)  -Follow diet recommendations listed below   Energy and Macronutrient Recomendations: Calories: 2000 Carbohydrate: 225 Protein: 150 Fat: 56  Demonstrated degree of understanding via:  Teach Back   Teaching Method Utilized:  Visual Auditory Hands on  Barriers to learning/adherence to lifestyle change: none identified   Patient to call the [facility] to enroll in Pre-Op and Post-Op Nutrition Education when surgery date is scheduled.

## 2016-07-13 MED FILL — METOPROLOL SUCC ER 25 MG TA: 25 | 90 days supply | Qty: 90 | Fill #0

## 2016-07-15 DIAGNOSIS — H33013 Retinal detachment with single break, bilateral: Secondary | ICD-10-CM | POA: Diagnosis not present

## 2016-07-19 ENCOUNTER — Ambulatory Visit (HOSPITAL_COMMUNITY)
Admission: RE | Admit: 2016-07-19 | Discharge: 2016-07-19 | Disposition: A | Discharge status: Home / Self Care | Payer: 59 | Source: Ambulatory Visit | Attending: General Surgery | Admitting: General Surgery

## 2016-07-19 DIAGNOSIS — M47816 Spondylosis without myelopathy or radiculopathy, lumbar region: Secondary | ICD-10-CM | POA: Diagnosis not present

## 2016-07-19 DIAGNOSIS — R05 Cough: Secondary | ICD-10-CM | POA: Diagnosis not present

## 2016-07-19 DIAGNOSIS — Z01818 Encounter for other preprocedural examination: Secondary | ICD-10-CM | POA: Diagnosis not present

## 2016-07-19 DIAGNOSIS — M4186 Other forms of scoliosis, lumbar region: Secondary | ICD-10-CM | POA: Diagnosis not present

## 2016-07-26 ENCOUNTER — Ambulatory Visit: Payer: 59 | Admitting: Skilled Nursing Facility1

## 2016-07-26 DIAGNOSIS — Q8789 Other specified congenital malformation syndromes, not elsewhere classified: Secondary | ICD-10-CM | POA: Insufficient documentation

## 2016-07-26 DIAGNOSIS — K219 Gastro-esophageal reflux disease without esophagitis: Secondary | ICD-10-CM | POA: Insufficient documentation

## 2016-07-26 DIAGNOSIS — F418 Other specified anxiety disorders: Secondary | ICD-10-CM | POA: Diagnosis not present

## 2016-07-26 DIAGNOSIS — F3341 Major depressive disorder, recurrent, in partial remission: Secondary | ICD-10-CM | POA: Diagnosis not present

## 2016-07-26 DIAGNOSIS — R159 Full incontinence of feces: Secondary | ICD-10-CM | POA: Diagnosis not present

## 2016-07-26 DIAGNOSIS — E785 Hyperlipidemia, unspecified: Secondary | ICD-10-CM | POA: Diagnosis not present

## 2016-07-26 DIAGNOSIS — I7121 Aneurysm of the ascending aorta, without rupture: Secondary | ICD-10-CM | POA: Insufficient documentation

## 2016-07-26 MED FILL — ESCITALOPRAM 10 MG TABLET: 10 | 90 days supply | Qty: 90 | Fill #0

## 2016-07-28 MED FILL — CELECOXIB 200 MG CAPSULE: 200 | 30 days supply | Qty: 60 | Fill #2

## 2016-08-09 ENCOUNTER — Encounter: Payer: Self-pay | Admitting: Skilled Nursing Facility1

## 2016-08-09 ENCOUNTER — Encounter: Payer: 59 | Attending: General Surgery | Admitting: Skilled Nursing Facility1

## 2016-08-09 DIAGNOSIS — E669 Obesity, unspecified: Secondary | ICD-10-CM | POA: Diagnosis not present

## 2016-08-09 DIAGNOSIS — Z6841 Body Mass Index (BMI) 40.0 and over, adult: Secondary | ICD-10-CM | POA: Diagnosis not present

## 2016-08-09 DIAGNOSIS — Z713 Dietary counseling and surveillance: Secondary | ICD-10-CM | POA: Diagnosis not present

## 2016-08-09 NOTE — Progress Notes (Signed)
Appt start time: 0900 end time:  0930  Assessment: 1st SWL Appointment.   Start Wt at [facility]: 471# Wt: 469.1# BMI: 50.27  Pt arrives having lost 2 pounds. Pt states that he's currently doing weight watchers to help with portion control and mentally shift to this being a lifestyle change. Pt states that he is building his support system. Pt states that his parents are not onboard with the surgery but his sister and niece are supportive. Pt states that his niece will be moving in with him during surgery recovery. Pt states that he's interested in Unjury Opurity once a day vitamin due to cost and to minimize frequency of taking daily vitamins/supplements. Did not have a chance to go through dietary recall. Pt states that he likes Premier Protein shakes.  Pt states that he needs 3 SWL visits.   Preferred Learning Style:   Auditory  Visual  Learning Readiness:   Contemplating  Ready  Change in progress     DIETARY INTAKE:  24-hr recall:  B ( AM): bacon sausage french toast Snk ( AM):  fruit----turkey jerky  L ( PM): baked chicekn and vegetable and rice Snk ( PM): cobbler D ( PM): fried chicken and ribs and cake Snk ( PM): Grilled chicken sweet potatoes and spinach Beverages: sweet tea, flavored water, hot tea  Usual physical activity: walking during work  Diet to Follow: 2000 calories 225 g carbohydrates 150 g protein 56 g fat   Nutritional Diagnosis:  Glandorf-3.3 Overweight/obesity related to past poor dietary habits and physical inactivity as evidenced by patient w/ recent sleeve gastrectomy surgery following dietary guidelines for continued weight loss.    Intervention:  Nutrition counseling for upcoming Bariatric Surgery. Goals: - Increase physical activity to 3 times a week - Continue to listen to your body for hunger and satisfaction  Teaching Method Utilized:  Visual Auditory  Handouts given during visit include: none  Barriers to learning/adherence to  lifestyle change: work schedule  Demonstrated degree of understanding via:  Teach Back   Monitoring/Evaluation:  Dietary intake, exercise, and body weight in 1 month(s).

## 2016-08-09 NOTE — Patient Instructions (Addendum)
-   Increase physical activity to 3 times a week - Continue to listen to your body for hunger and satisfaction

## 2016-08-16 DIAGNOSIS — F329 Major depressive disorder, single episode, unspecified: Secondary | ICD-10-CM | POA: Diagnosis not present

## 2016-09-02 ENCOUNTER — Ambulatory Visit (INDEPENDENT_AMBULATORY_CARE_PROVIDER_SITE_OTHER): Payer: 59 | Admitting: Psychiatry

## 2016-09-02 DIAGNOSIS — F509 Eating disorder, unspecified: Secondary | ICD-10-CM

## 2016-09-06 ENCOUNTER — Ambulatory Visit: Payer: 59 | Admitting: Registered"

## 2016-09-07 ENCOUNTER — Ambulatory Visit: Payer: 59 | Admitting: Registered"

## 2016-09-08 ENCOUNTER — Encounter: Payer: 59 | Attending: General Surgery | Admitting: Registered"

## 2016-09-08 ENCOUNTER — Encounter: Payer: Self-pay | Admitting: Registered"

## 2016-09-08 ENCOUNTER — Ambulatory Visit: Payer: 59 | Admitting: Registered"

## 2016-09-08 DIAGNOSIS — E669 Obesity, unspecified: Secondary | ICD-10-CM | POA: Diagnosis not present

## 2016-09-08 DIAGNOSIS — Z713 Dietary counseling and surveillance: Secondary | ICD-10-CM | POA: Diagnosis not present

## 2016-09-08 DIAGNOSIS — Z6841 Body Mass Index (BMI) 40.0 and over, adult: Secondary | ICD-10-CM | POA: Diagnosis not present

## 2016-09-08 NOTE — Progress Notes (Signed)
Appt start time: 8:17 end time:  8:45  Assessment: 2nd SWL Appointment.   Start Wt at [facility]: 471# Wt: 475.1 BMI: 50.91   Pt arrives having gained 6 lbs from previous visit. Pt states he used food as coping, especially when he's tired and that his motivation has decreased. Pt states his physical activity has increased and he gets about 8,000 steps twice a week with walking. Pt reports his biggest goal from surgery is for his body to tell him when to stop eating. Pt reports a friend had lap band surgery and her body lets her know when to stop eating. Pt is concerned because his body doesn't tell him when to stop. Pr reports he doesn't recognize satisfied; he is familiar with being full and finishing his plate. Pt states his daily schedule changes, some days he works 1st shift and some days he works 3rd shift. Pt states he needs help with being committed again. Pt states he may not be able to get the surgery until October 2018 but wants to continue having nutrition visits until then for accountability.   Pt states that he needs 3 SWL visits.    Preferred Learning Style:   Auditory  Visual  Learning Readiness:   Contemplating  Ready  Change in progress    DIETARY INTAKE:  24-hr recall:  B ( AM): Premier Protein shake Snk ( AM): none L ( PM): ChicFilA-salad and some chicken nuggets Snk ( PM): grilled chicken bites, chicken and rice bowl D ( PM): grilled chicken, black beans, cheese, jalapenos, salsa, sour cream, tortilla chips Snk ( PM): none Beverages: diet lemonade, flavored water, 2% milk  Usual physical activity: walking during work (8,000 steps twice a week)  Diet to Follow: 2000 calories 225 g carbohydrates 150 g protein 56 g fat   Nutritional Diagnosis:  Thoreau-3.3 Overweight/obesity related to past poor dietary habits and physical inactivity as evidenced by patient w/ upcoming sleeve gastrectomy surgery following dietary guidelines for continued weight loss.     Intervention:  Nutrition counseling for upcoming Bariatric Surgery.  Goals: - Chewing at least 30 times per bite or to applesauce consistency. Listen to body and when it is telling you that it is satisfied. - Add in non-starchy vegetables to meals. Make half your plate non-starchy vegetables such as cabbage, peppers, onions, mushrooms, collards, kale. Anything except corn, peas, white potatoes, and sweet potatoes.  - Add in protein snacks throughout your day: protein shake, greek yogurt, Malawiturkey jerky, cheese, etc.    Teaching Method Utilized:  Visual Auditory  Handouts given during visit include:  - 101 Things to do besides eat  Barriers to learning/adherence to lifestyle change: work schedule  Demonstrated degree of understanding via:  Teach Back   Monitoring/Evaluation:  Dietary intake, exercise, and body weight in 1 month(s).

## 2016-09-08 NOTE — Patient Instructions (Addendum)
-   Chewing at least 30 times per bite or to applesauce consistency. Listen to body and when it is telling you that it is satisfied.  - Add in non-starchy vegetables to meals. Make half your plate non-starchy vegetables such as cabbage, peppers, onions, mushrooms, collards, kale. Anything except corn, peas, white potatoes, and sweet potatoes.   - Add in protein snacks throughout your day: protein shake, greek yogurt, Malawiturkey jerky, cheese, etc.

## 2016-09-24 MED FILL — AMOXICILLIN 500 MG CAPSULE: 500 | 4 days supply | Qty: 16 | Fill #0

## 2016-10-04 ENCOUNTER — Encounter: Payer: 59 | Attending: General Surgery | Admitting: Skilled Nursing Facility1

## 2016-10-04 ENCOUNTER — Encounter: Payer: Self-pay | Admitting: Skilled Nursing Facility1

## 2016-10-04 DIAGNOSIS — Z6841 Body Mass Index (BMI) 40.0 and over, adult: Secondary | ICD-10-CM | POA: Insufficient documentation

## 2016-10-04 DIAGNOSIS — Z713 Dietary counseling and surveillance: Secondary | ICD-10-CM | POA: Insufficient documentation

## 2016-10-04 DIAGNOSIS — E669 Obesity, unspecified: Secondary | ICD-10-CM | POA: Insufficient documentation

## 2016-10-04 NOTE — Progress Notes (Signed)
Appt start time: 8:17 end time:  8:45  Assessment: 3rd  SWL Appointment.   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 Pt arrives having lost about 5 or 6 pounds. Pt states he has had a Little bit of success with chewing slowly but hard to remember. Pt states he does Not have dedicated exercise time but trying to be more active targeting [id number] steps. Pt states his  Niece moved in and she grocery shops so doing better about adding vegetables. Pt states he Needs to push getting exercise in scheduling being his biggest barrier. Pt states he is working on a 6 month plan to be successful with surgery.    Preferred Learning Style:   Auditory  Visual  Learning Readiness:   Contemplating  Ready  Change in progress    DIETARY INTAKE:  24-hr recall:  B ( AM): Premier Protein shake Snk ( AM): none L ( PM): ChicFilA-salad and some chicken nuggets Snk ( PM): grilled chicken bites, chicken and rice bowl D ( PM): grilled chicken, black beans, cheese, jalapenos, salsa, sour cream, tortilla chips Snk ( PM): none Beverages: diet lemonade, flavored water, 2% milk  Usual physical activity: walking during work (8,000 steps twice a week)  Diet to Follow: 2000 calories 225 g carbohydrates 150 g protein 56 g fat   Nutritional Diagnosis:  Sterling-3.3 Overweight/obesity related to past poor dietary habits and physical inactivity as evidenced by patient w/ upcoming sleeve gastrectomy surgery following dietary guidelines for continued weight loss.    Intervention:  Nutrition counseling for upcoming Bariatric Surgery.  Goals: - Chewing at least 30 times per bite or to applesauce consistency. Listen to body and when it is telling you that it is satisfied. - Add in non-starchy vegetables to meals. Make half your plate non-starchy vegetables such as cabbage, peppers, onions, mushrooms, collards, kale. Anything except corn, peas, white potatoes, and sweet potatoes.  - Add in  protein snacks throughout your day: protein shake, greek yogurt, Malawiturkey jerky, cheese, etc.    Teaching Method Utilized:  Visual Auditory  Handouts given during visit include:  - 101 Things to do besides eat  Barriers to learning/adherence to lifestyle change: work schedule  Demonstrated degree of understanding via:  Teach Back   Monitoring/Evaluation:  Dietary intake, exercise, and body weight in 1 month(s).

## 2016-10-06 DIAGNOSIS — Z9889 Other specified postprocedural states: Secondary | ICD-10-CM | POA: Diagnosis not present

## 2016-10-06 DIAGNOSIS — Q8740 Marfan's syndrome, unspecified: Secondary | ICD-10-CM | POA: Diagnosis not present

## 2016-10-06 DIAGNOSIS — R600 Localized edema: Secondary | ICD-10-CM | POA: Diagnosis not present

## 2016-10-07 DIAGNOSIS — R600 Localized edema: Secondary | ICD-10-CM | POA: Diagnosis not present

## 2016-10-18 ENCOUNTER — Encounter: Payer: 59 | Admitting: Registered"

## 2016-10-18 DIAGNOSIS — Z713 Dietary counseling and surveillance: Secondary | ICD-10-CM | POA: Diagnosis not present

## 2016-10-18 DIAGNOSIS — E669 Obesity, unspecified: Secondary | ICD-10-CM

## 2016-10-18 DIAGNOSIS — Z6841 Body Mass Index (BMI) 40.0 and over, adult: Secondary | ICD-10-CM | POA: Diagnosis not present

## 2016-10-18 MED FILL — LOSARTAN POTASSIUM 100 MG T: 100 | 90 days supply | Qty: 90 | Fill #1

## 2016-10-18 NOTE — Progress Notes (Signed)
  Pre-Operative Nutrition Class:  Appt start time: 8:15   End time:  9:15  Patient was seen on 10/18/2016 for Pre-Operative Bariatric Surgery Education [facility]   Surgery date: to be determined Surgery type: sleeve gastrectomy Start weight at [facility]: 471 Weight today: 475.2  TANITA  BODY COMP RESULTS  10/18/2016   BMI (kg/m^2) N/A   Fat Mass (lbs)    Fat Free Mass (lbs)    Total Body Water (lbs)    Samples given per MNT protocol. Patient educated on appropriate usage: Bariatric Advantage Multivitamin Lot #[id number] Exp: 10/2017  Bariatric Advantage Calcium Citrate Lot #[id number] Exp: 01/13/2017  Bariatric Fusion Calcium Soft Chews  Lot #[id number] Exp: 09/01/2017  Renee Pain Protein Shake Lot #[id number] Exp: 03/06/2017  The following the learning objectives were met by the patient during this course:  Identify Pre-Op Dietary Goals and will begin 2 weeks pre-operatively  Identify appropriate sources of fluids and proteins   State protein recommendations and appropriate sources pre and post-operatively  Identify Post-Operative Dietary Goals and will follow for 2 weeks post-operatively  Identify appropriate multivitamin and calcium sources  Describe the need for physical activity post-operatively and will follow MD recommendations  State when to call healthcare provider regarding medication questions or post-operative complications  Handouts given during class include:  Pre-Op Bariatric Surgery Diet Handout  Protein Shake Handout  Post-Op Bariatric Surgery Nutrition Handout  [facility] Program Information Flyer  Support Group Information Flyer  [facility] Bariatric Supplements Price List  Follow-Up Plan: Patient will follow-up at [facility] 2 weeks post operatively for diet advancement per MD.

## 2016-10-20 DIAGNOSIS — R600 Localized edema: Secondary | ICD-10-CM | POA: Diagnosis not present

## 2016-10-21 MED FILL — CONTRAVE ER 8-90 MG TABLET: 8-90 | 28 days supply | Qty: 70 | Fill #0

## 2016-11-08 DIAGNOSIS — R600 Localized edema: Secondary | ICD-10-CM | POA: Diagnosis not present

## 2016-11-08 DIAGNOSIS — Z9889 Other specified postprocedural states: Secondary | ICD-10-CM | POA: Diagnosis not present

## 2016-11-08 DIAGNOSIS — Q8740 Marfan's syndrome, unspecified: Secondary | ICD-10-CM | POA: Diagnosis not present

## 2016-11-09 MED FILL — METOPROLOL SUCC ER 25 MG TA: 25 | 90 days supply | Qty: 90 | Fill #0

## 2016-12-01 ENCOUNTER — Other Ambulatory Visit: Payer: Self-pay | Admitting: Sports Medicine

## 2016-12-01 DIAGNOSIS — M170 Bilateral primary osteoarthritis of knee: Secondary | ICD-10-CM

## 2016-12-01 MED FILL — CONTRAVE ER 8-90 MG TABLET: 8-90 | 30 days supply | Qty: 120 | Fill #1

## 2016-12-01 MED FILL — ATORVASTATIN 20 MG TABLET: 20 | 90 days supply | Qty: 90 | Fill #1

## 2016-12-02 MED FILL — CELECOXIB 200 MG CAPSULE: 200 | 30 days supply | Qty: 60 | Fill #0

## 2016-12-10 DIAGNOSIS — Q8740 Marfan's syndrome, unspecified: Secondary | ICD-10-CM | POA: Diagnosis not present

## 2016-12-10 DIAGNOSIS — Z8679 Personal history of other diseases of the circulatory system: Secondary | ICD-10-CM | POA: Diagnosis not present

## 2016-12-10 DIAGNOSIS — Z9889 Other specified postprocedural states: Secondary | ICD-10-CM | POA: Diagnosis not present

## 2016-12-10 DIAGNOSIS — I351 Nonrheumatic aortic (valve) insufficiency: Secondary | ICD-10-CM | POA: Diagnosis not present

## 2016-12-20 DIAGNOSIS — F329 Major depressive disorder, single episode, unspecified: Secondary | ICD-10-CM | POA: Diagnosis not present

## 2016-12-23 ENCOUNTER — Ambulatory Visit (INDEPENDENT_AMBULATORY_CARE_PROVIDER_SITE_OTHER): Payer: 59 | Admitting: Psychiatry

## 2016-12-23 DIAGNOSIS — F509 Eating disorder, unspecified: Secondary | ICD-10-CM | POA: Diagnosis not present

## 2016-12-30 DIAGNOSIS — M1711 Unilateral primary osteoarthritis, right knee: Secondary | ICD-10-CM | POA: Diagnosis not present

## 2016-12-30 DIAGNOSIS — M1712 Unilateral primary osteoarthritis, left knee: Secondary | ICD-10-CM | POA: Diagnosis not present

## 2017-01-12 NOTE — Progress Notes (Signed)
Please place orders in EPIC as patient is being scheduled for a pre-op appointment! Thank you! 

## 2017-01-13 DIAGNOSIS — H33023 Retinal detachment with multiple breaks, bilateral: Secondary | ICD-10-CM | POA: Diagnosis not present

## 2017-01-13 DIAGNOSIS — H43812 Vitreous degeneration, left eye: Secondary | ICD-10-CM | POA: Diagnosis not present

## 2017-01-17 DIAGNOSIS — F329 Major depressive disorder, single episode, unspecified: Secondary | ICD-10-CM | POA: Diagnosis not present

## 2017-01-17 MED FILL — ESCITALOPRAM 10 MG TABLET: 10 | 90 days supply | Qty: 90 | Fill #1

## 2017-01-24 DIAGNOSIS — Z9889 Other specified postprocedural states: Secondary | ICD-10-CM | POA: Diagnosis not present

## 2017-01-24 DIAGNOSIS — Q8740 Marfan's syndrome, unspecified: Secondary | ICD-10-CM | POA: Diagnosis not present

## 2017-01-24 DIAGNOSIS — R600 Localized edema: Secondary | ICD-10-CM | POA: Diagnosis not present

## 2017-01-25 NOTE — Patient Instructions (Addendum)
CURLY MACKOWSKI  01/25/2017   Your procedure is scheduled on: 02/07/2017    Report to [facility] Main  Entrance Take [place]  elevators to 3rd floor to  Short Stay Center at  0515 AM.    Call this number if you have problems the morning of surgery [phone]    Remember: ONLY 1 PERSON MAY GO WITH YOU TO SHORT STAY TO GET  READY MORNING OF YOUR SURGERY.  Do not eat food or drink liquids :After Midnight.     Take these medicines the morning of surgery with A SIP OF WATER: lexapro, metoprolol ( toprol)                                You may not have any metal on your body including hair pins and              piercings  Do not wear jewelry, make-up, lotions, powders or perfumes, deodorant             Do not wear nail polish.  Do not shave  48 hours prior to surgery.              Men may shave face and neck.   Do not bring valuables to the hospital. [facility] IS NOT             RESPONSIBLE   FOR VALUABLES.  Contacts, dentures or bridgework may not be worn into surgery.  Leave suitcase in the car. After surgery it may be brought to your room.     Special Instructions: N/A              Please read over the following fact sheets you were given: _____________________________________________________________________             [facility] - Preparing for Surgery Before surgery, you can play an important role.  Because skin is not sterile, your skin needs to be as free of germs as possible.  You can reduce the number of germs on your skin by washing with CHG (chlorahexidine gluconate) soap before surgery.  CHG is an antiseptic cleaner which kills germs and bonds with the skin to continue killing germs even after washing. Please DO NOT use if you have an allergy to CHG or antibacterial soaps.  If your skin becomes reddened/irritated stop using the CHG and inform your nurse when you arrive at Short Stay. Do not shave (including legs and underarms) for at least 48  hours prior to the first CHG shower.  You may shave your face/neck. Please follow these instructions carefully:  1.  Shower with CHG Soap the night before surgery and the  morning of Surgery.  2.  If you choose to wash your hair, wash your hair first as usual with your  normal  shampoo.  3.  After you shampoo, rinse your hair and body thoroughly to remove the  shampoo.                           4.  Use CHG as you would any other liquid soap.  You can apply chg directly  to the skin and wash                       Gently with  a scrungie or clean washcloth.  5.  Apply the CHG Soap to your body ONLY FROM THE NECK DOWN.   Do not use on face/ open                           Wound or open sores. Avoid contact with eyes, ears mouth and genitals (private parts).                       Wash face,  Genitals (private parts) with your normal soap.             6.  Wash thoroughly, paying special attention to the area where your surgery  will be performed.  7.  Thoroughly rinse your body with warm water from the neck down.  8.  DO NOT shower/wash with your normal soap after using and rinsing off  the CHG Soap.                9.  Pat yourself dry with a clean towel.            10.  Wear clean pajamas.            11.  Place clean sheets on your bed the night of your first shower and do not  sleep with pets. Day of Surgery : Do not apply any lotions/deodorants the morning of surgery.  Please wear clean clothes to the hospital/surgery center.  FAILURE TO FOLLOW THESE INSTRUCTIONS MAY RESULT IN THE CANCELLATION OF YOUR SURGERY PATIENT SIGNATURE_________________________________  NURSE SIGNATURE__________________________________  ________________________________________________________________________

## 2017-01-26 DIAGNOSIS — Z0000 Encounter for general adult medical examination without abnormal findings: Secondary | ICD-10-CM | POA: Diagnosis not present

## 2017-01-26 NOTE — Progress Notes (Signed)
Please place orders in EPIC as patient has a Pre-op appointment on 01/31/2017! Thank you!

## 2017-01-27 NOTE — Progress Notes (Signed)
Please place orders in EPIC as patient has a pre-op appointment on 01/31/2017! Thank you! 

## 2017-01-28 NOTE — Progress Notes (Signed)
Please place orders in EPIC as patient has a pre-op appointment on 01/31/2017! Thank you!

## 2017-01-31 ENCOUNTER — Encounter (HOSPITAL_COMMUNITY)
Admission: RE | Admit: 2017-01-31 | Discharge: 2017-01-31 | Disposition: A | Discharge status: Home / Self Care | Payer: 59 | Source: Ambulatory Visit | Attending: General Surgery | Admitting: General Surgery

## 2017-01-31 ENCOUNTER — Encounter (HOSPITAL_COMMUNITY): Payer: Self-pay

## 2017-01-31 DIAGNOSIS — G4733 Obstructive sleep apnea (adult) (pediatric): Secondary | ICD-10-CM | POA: Diagnosis not present

## 2017-01-31 DIAGNOSIS — Z6841 Body Mass Index (BMI) 40.0 and over, adult: Secondary | ICD-10-CM | POA: Diagnosis not present

## 2017-01-31 DIAGNOSIS — Z01818 Encounter for other preprocedural examination: Secondary | ICD-10-CM | POA: Diagnosis not present

## 2017-01-31 DIAGNOSIS — Z23 Encounter for immunization: Secondary | ICD-10-CM | POA: Diagnosis not present

## 2017-01-31 DIAGNOSIS — D649 Anemia, unspecified: Secondary | ICD-10-CM | POA: Diagnosis not present

## 2017-01-31 DIAGNOSIS — R5383 Other fatigue: Secondary | ICD-10-CM | POA: Diagnosis not present

## 2017-01-31 DIAGNOSIS — Z0000 Encounter for general adult medical examination without abnormal findings: Secondary | ICD-10-CM | POA: Diagnosis not present

## 2017-01-31 DIAGNOSIS — M170 Bilateral primary osteoarthritis of knee: Secondary | ICD-10-CM | POA: Diagnosis not present

## 2017-01-31 DIAGNOSIS — F3341 Major depressive disorder, recurrent, in partial remission: Secondary | ICD-10-CM | POA: Diagnosis not present

## 2017-01-31 HISTORY — DX: Anxiety disorder, unspecified: F41.9

## 2017-01-31 HISTORY — DX: Cardiac murmur, unspecified: R01.1

## 2017-01-31 HISTORY — DX: Gastro-esophageal reflux disease without esophagitis: K21.9

## 2017-01-31 HISTORY — DX: Respiratory tuberculosis unspecified: A15.9

## 2017-01-31 LAB — COMPREHENSIVE METABOLIC PANEL
ALBUMIN: 3.4 g/dL — AB (ref 3.5–5.0)
ALK PHOS: 62 U/L (ref 38–126)
ALT: 18 U/L (ref 17–63)
ANION GAP: 7 (ref 5–15)
AST: 23 U/L (ref 15–41)
BILIRUBIN TOTAL: 0.3 mg/dL (ref 0.3–1.2)
BUN: 23 mg/dL — AB (ref 6–20)
CALCIUM: 8.9 mg/dL (ref 8.9–10.3)
CO2: 25 mmol/L (ref 22–32)
Chloride: 107 mmol/L (ref 101–111)
Creatinine, Ser: 1.08 mg/dL (ref 0.61–1.24)
GFR calc Af Amer: >60 mL/min (ref >60)
GLUCOSE: 100 mg/dL — AB (ref 65–99)
Potassium: 4.2 mmol/L (ref 3.5–5.1)
Sodium: 139 mmol/L (ref 135–145)
TOTAL PROTEIN: 7.2 g/dL (ref 6.5–8.1)

## 2017-01-31 LAB — CBC
HCT: 39.9 % (ref 39.0–52.0)
Hemoglobin: 13.2 g/dL (ref 13.0–17.0)
MCH: 26.1 pg (ref 26.0–34.0)
MCHC: 33.1 g/dL (ref 30.0–36.0)
MCV: 78.9 fL (ref 78.0–100.0)
PLATELETS: 269 10*3/uL (ref 150–400)
RBC: 5.06 MIL/uL (ref 4.22–5.81)
RDW: 15.2 % (ref 11.5–15.5)
WBC: 5.3 10*3/uL (ref 4.0–10.5)

## 2017-01-31 MED FILL — traMADol HCL 50 MG TABS: 50 | 8 days supply | Qty: 30 | Fill #0

## 2017-01-31 MED FILL — PANTOPRAZOLE SOD DR 40 MG T: 40 | 30 days supply | Qty: 30 | Fill #0

## 2017-01-31 NOTE — Progress Notes (Signed)
Requested most recent office visit note from Dr Jacinto Halim. No EKG- last one at Dr Jacinto Halim was 12/2015 per office.

## 2017-01-31 NOTE — Progress Notes (Signed)
CXR-07/19/16-epic

## 2017-01-31 NOTE — Progress Notes (Signed)
CMP done 01/31/2017 faxed via epic to Dr Sheliah Hatch.

## 2017-01-31 NOTE — Progress Notes (Signed)
Need orders in epic.  Surgery on 02/07/2017.

## 2017-02-01 NOTE — Progress Notes (Signed)
Requested by fax from [facility] most recent ekg .

## 2017-02-02 NOTE — Progress Notes (Signed)
REceivd and placed on chart 12 lead EKG from 2012.

## 2017-02-03 NOTE — Progress Notes (Signed)
Please place orders in EPIC as patient has surgery on 02/07/2017! Thank you!

## 2017-02-04 NOTE — H&P (Signed)
History of Present Illness Robert Carl MD; 01/26/2017 9:44 AM) The patient is a 41 year old male who presents for a bariatric surgery evaluation. Patient has completed all necessary workup in the preoperative phase for bariatric surgery. The patient is now ready to proceed with surgery. The patient has made moderate improvements in her diet and is exercising well and has no new medical issues or new medications.    Allergies (Tanisha A. Manson Passey, RMA; 01/26/2017 8:49 AM) No Known Drug Allergies 06/25/2016 Allergies Reconciled   Medication History (Tanisha A. Manson Passey, RMA; 01/26/2017 8:49 AM) Atorvastatin Calcium (  Tablet, Oral) Active. Celecoxib (  Capsule, Oral) Active. Escitalopram Oxalate (  Tablet, Oral) Active. Losartan Potassium (  Tablet, Oral) Active. Metoprolol Succinate ER (  Tablet ER 24HR, Oral) Active. Multivitamin Adult (Oral) Active. Medications Reconciled    Review of Systems Robert Carl MD; 01/26/2017 9:43 AM) General Not Present- Appetite Loss, Chills, Fatigue, Fever, Night Sweats, Weight Gain and Weight Loss. Skin Not Present- Change in Wart/Mole, Dryness, Hives, Jaundice, New Lesions, Non-Healing Wounds, Rash and Ulcer. HEENT Present- Wears glasses/contact lenses. Not Present- Earache, Hearing Loss, Hoarseness, Nose Bleed, Oral Ulcers, Ringing in the Ears, Seasonal Allergies, Sinus Pain, Sore Throat, Visual Disturbances and Yellow Eyes. Respiratory Not Present- Bloody sputum, Chronic Cough, Difficulty Breathing, Snoring and Wheezing. Breast Not Present- Breast Mass, Breast Pain, Nipple Discharge and Skin Changes. Cardiovascular Present- Swelling of Extremities. Not Present- Chest Pain, Difficulty Breathing Lying Down, Leg Cramps, Palpitations, Rapid Heart Rate and Shortness of Breath. Gastrointestinal Not Present- Abdominal Pain, Bloating, Bloody Stool, Change in Bowel Habits, Chronic diarrhea, Constipation, Difficulty Swallowing,  Excessive gas, Gets full quickly at meals, Hemorrhoids, Indigestion, Nausea, Rectal Pain and Vomiting. Male Genitourinary Not Present- Blood in Urine, Change in Urinary Stream, Frequency, Impotence, Nocturia, Painful Urination, Urgency and Urine Leakage. Musculoskeletal Present- Joint Pain and Joint Stiffness. Not Present- Back Pain, Muscle Pain, Muscle Weakness and Swelling of Extremities. Neurological Not Present- Decreased Memory, Fainting, Headaches, Numbness, Seizures, Tingling, Tremor, Trouble walking and Weakness. Psychiatric Present- Depression. Not Present- Anxiety, Bipolar, Change in Sleep Pattern, Fearful and Frequent crying. Endocrine Not Present- Cold Intolerance, Excessive Hunger, Hair Changes, Heat Intolerance and New Diabetes. Hematology Not Present- Blood Thinners, Easy Bruising, Excessive bleeding, Gland problems, HIV and Persistent Infections.  Vitals (Tanisha A. Brown RMA; 01/26/2017 8:48 AM) 01/26/2017 8:47 AM Weight: 473.6 lb Height: 81in Body Surface Area: 3.35 m Body Mass Index: 50.75 kg/m  Temp.: 98.69F  Pulse: 95 (Regular)  P.OX: 95% (Room air) BP: 122/78 (Sitting, Left Arm, Standard)       Physical Exam Robert Carl MD; 01/26/2017 9:44 AM) General Mental Status-Alert. General Appearance-Cooperative. Orientation-Oriented X4. Build & Nutrition-Obese. Posture-Normal posture.  Integumentary Global Assessment Upon inspection and palpation of skin surfaces of the - Head/Face: no rashes, ulcers, lesions or evidence of photo damage. No palpable nodules or masses and Neck: no visible lesions or palpable masses.  Head and Neck Head-normocephalic, atraumatic with no lesions or palpable masses. Face Global Assessment - atraumatic. Thyroid Gland Characteristics - normal size and consistency.  Eye Eyeball - Bilateral-Extraocular movements intact. Sclera/Conjunctiva - Bilateral-No scleral icterus, No Discharge.  ENMT Nose and  Sinuses External Inspection of the Nose - no deformities observed, no swelling present.  Chest and Lung Exam Palpation Palpation of the chest reveals - Non-tender. Auscultation Breath sounds - Normal.  Cardiovascular Auscultation Rhythm - Regular. Heart Sounds - S1 WNL and S2 WNL. Carotid arteries - No Carotid bruit.  Abdomen Inspection Inspection of the abdomen reveals -  No Visible peristalsis, No Abnormal pulsations and No Paradoxical movements. Palpation/Percussion Palpation and Percussion of the abdomen reveal - Soft, Non Tender, No Rebound tenderness, No Rigidity (guarding), No hepatosplenomegaly and No Palpable abdominal masses.  Peripheral Vascular Upper Extremity Palpation - Pulses bilaterally normal. Lower Extremity Palpation - Edema - Bilateral - No edema.  Neurologic Neurologic evaluation reveals -normal sensation and normal coordination.  Neuropsychiatric Mental status exam performed with findings of-able to articulate well with normal speech/language, rate, volume and coherence and thought content normal with ability to perform basic computations and apply abstract reasoning.  Musculoskeletal Normal Exam - Bilateral-Upper Extremity Strength Normal and Lower Extremity Strength Normal.    Assessment & Plan Robert Carl MD; 01/26/2017 9:45 AM) MORBID OBESITY (E66.25) Story: 41 year old male with morbid obesity. 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 Since that repair he has been able to more activities. He is most interested in the sleeve. I think this is the ideal procedure for him given that he may require additional cardiac procedures, the sleeve allows the least amount of malabsorption and the most options for future treatments. He is now performed for sleeve gastrectomy and we will proceed with that. His upper GI was negative. Impression: We again discussed the details of the operation: Will be done under  general anesthesia with an endotracheal tube, that it will be a laparoscopic procedure with 6 small incisions large one being 1.5-2in, that we will remove remove the short gastrics and other vessels to the greater curve of the stomach, place a large Bougie down the stomach and then remove a percent of the stomach using a linear stapler. We discussed the procedure will take approximately 1.5-2 hours. We discussed risks of VTE, staple line leak, skin infection, sleeve stenosis, incisional hernia, need for open procedure, and postoperative nausea and vomiting.

## 2017-02-07 ENCOUNTER — Encounter (HOSPITAL_COMMUNITY): Payer: Self-pay | Admitting: *Deleted

## 2017-02-07 ENCOUNTER — Inpatient Hospital Stay (HOSPITAL_COMMUNITY): Payer: 59 | Admitting: Anesthesiology

## 2017-02-07 ENCOUNTER — Inpatient Hospital Stay (HOSPITAL_COMMUNITY)
Admission: RE | Admit: 2017-02-07 | Discharge: 2017-02-08 | DRG: 621 | Disposition: A | Discharge status: Home / Self Care | Payer: 59 | Source: Ambulatory Visit | Attending: General Surgery | Admitting: General Surgery

## 2017-02-07 ENCOUNTER — Encounter (HOSPITAL_COMMUNITY)
Admission: RE | Disposition: A | Discharge status: Home / Self Care | Payer: Self-pay | Source: Ambulatory Visit | Attending: General Surgery

## 2017-02-07 DIAGNOSIS — K2950 Unspecified chronic gastritis without bleeding: Secondary | ICD-10-CM | POA: Diagnosis not present

## 2017-02-07 DIAGNOSIS — M170 Bilateral primary osteoarthritis of knee: Secondary | ICD-10-CM | POA: Diagnosis not present

## 2017-02-07 DIAGNOSIS — G4733 Obstructive sleep apnea (adult) (pediatric): Secondary | ICD-10-CM | POA: Diagnosis not present

## 2017-02-07 DIAGNOSIS — I10 Essential (primary) hypertension: Secondary | ICD-10-CM | POA: Diagnosis not present

## 2017-02-07 DIAGNOSIS — R066 Hiccough: Secondary | ICD-10-CM | POA: Diagnosis present

## 2017-02-07 DIAGNOSIS — G4730 Sleep apnea, unspecified: Secondary | ICD-10-CM | POA: Diagnosis present

## 2017-02-07 DIAGNOSIS — F418 Other specified anxiety disorders: Secondary | ICD-10-CM | POA: Diagnosis present

## 2017-02-07 DIAGNOSIS — Z6841 Body Mass Index (BMI) 40.0 and over, adult: Secondary | ICD-10-CM

## 2017-02-07 DIAGNOSIS — M545 Low back pain: Secondary | ICD-10-CM | POA: Diagnosis not present

## 2017-02-07 HISTORY — PX: LAPAROSCOPIC GASTRIC SLEEVE RESECTION: SHX5895

## 2017-02-07 LAB — CBC
HEMATOCRIT: 40.2 % (ref 39.0–52.0)
HEMOGLOBIN: 13.1 g/dL (ref 13.0–17.0)
MCH: 26.3 pg (ref 26.0–34.0)
MCHC: 32.6 g/dL (ref 30.0–36.0)
MCV: 80.7 fL (ref 78.0–100.0)
Platelets: 256 10*3/uL (ref 150–400)
RBC: 4.98 MIL/uL (ref 4.22–5.81)
RDW: 15.2 % (ref 11.5–15.5)
WBC: 9 10*3/uL (ref 4.0–10.5)

## 2017-02-07 LAB — CREATININE, SERUM: Creatinine, Ser: 0.94 mg/dL (ref 0.61–1.24)

## 2017-02-07 LAB — HEMOGLOBIN AND HEMATOCRIT, BLOOD
HEMATOCRIT: 39.9 % (ref 39.0–52.0)
HEMOGLOBIN: 13 g/dL (ref 13.0–17.0)

## 2017-02-07 SURGERY — GASTRECTOMY, SLEEVE, LAPAROSCOPIC
Anesthesia: General | Site: Abdomen

## 2017-02-07 MED ORDER — PROPOFOL 10 MG/ML IV BOLUS
INTRAVENOUS | Status: DC | PRN
  Administered 2017-02-07: 300 mg via INTRAVENOUS

## 2017-02-07 MED ORDER — DEXAMETHASONE SODIUM PHOSPHATE 10 MG/ML IJ SOLN
INTRAMUSCULAR | Status: DC | PRN
  Administered 2017-02-07: 10 mg via INTRAVENOUS

## 2017-02-07 MED ORDER — BUPIVACAINE-EPINEPHRINE (PF) 0.25% -1:200000 IJ SOLN
INTRAMUSCULAR | Status: DC | PRN
  Administered 2017-02-07: 30 mL

## 2017-02-07 MED ORDER — CEFOTETAN DISODIUM-DEXTROSE 2-2.08 GM-% IV SOLR
2.0000 g | INTRAVENOUS | Status: AC
  Administered 2017-02-07: 2 g via INTRAVENOUS

## 2017-02-07 MED ORDER — LIDOCAINE 2% (20 MG/ML) 5 ML SYRINGE
INTRAMUSCULAR | Status: AC
  Filled 2017-02-07: qty 5

## 2017-02-07 MED ORDER — ONDANSETRON HCL 4 MG/2ML IJ SOLN
INTRAMUSCULAR | Status: AC
  Filled 2017-02-07: qty 2

## 2017-02-07 MED ORDER — ENOXAPARIN SODIUM 30 MG/0.3ML ~~LOC~~ SOLN
30.0000 mg | Freq: Two times a day (BID) | SUBCUTANEOUS | Status: DC
  Administered 2017-02-07 – 2017-02-08 (×3): 30 mg via SUBCUTANEOUS
  Filled 2017-02-07 (×3): qty 0.3

## 2017-02-07 MED ORDER — SUGAMMADEX SODIUM 500 MG/5ML IV SOLN
INTRAVENOUS | Status: DC | PRN
  Administered 2017-02-07: 500 mg via INTRAVENOUS

## 2017-02-07 MED ORDER — SCOPOLAMINE 1 MG/3DAYS TD PT72
1.0000 | MEDICATED_PATCH | TRANSDERMAL | Status: DC
  Administered 2017-02-07: 1.5 mg via TRANSDERMAL
  Filled 2017-02-07: qty 1

## 2017-02-07 MED ORDER — ONDANSETRON HCL 4 MG/2ML IJ SOLN: 4.0000 mg | INTRAMUSCULAR | Status: DC | PRN

## 2017-02-07 MED ORDER — ROCURONIUM BROMIDE 50 MG/5ML IV SOSY
PREFILLED_SYRINGE | INTRAVENOUS | Status: AC
  Filled 2017-02-07: qty 5

## 2017-02-07 MED ORDER — STERILE WATER FOR IRRIGATION IR SOLN
Status: DC | PRN
  Administered 2017-02-07: 500 mL

## 2017-02-07 MED ORDER — HYDROMORPHONE HCL-NACL 0.5-0.9 MG/ML-% IV SOSY
PREFILLED_SYRINGE | INTRAVENOUS | Status: AC
  Filled 2017-02-07: qty 2

## 2017-02-07 MED ORDER — LACTATED RINGERS IV SOLN
INTRAVENOUS | Status: DC | PRN
  Administered 2017-02-07 (×2): via INTRAVENOUS

## 2017-02-07 MED ORDER — EPHEDRINE 5 MG/ML INJ
INTRAVENOUS | Status: AC
  Filled 2017-02-07: qty 10

## 2017-02-07 MED ORDER — CEFOTETAN DISODIUM-DEXTROSE 2-2.08 GM-% IV SOLR
INTRAVENOUS | Status: AC
  Filled 2017-02-07: qty 50

## 2017-02-07 MED ORDER — FENTANYL CITRATE (PF) 100 MCG/2ML IJ SOLN
INTRAMUSCULAR | Status: AC
  Filled 2017-02-07: qty 2

## 2017-02-07 MED ORDER — BUPIVACAINE LIPOSOME 1.3 % IJ SUSP
20.0000 mL | Freq: Once | INTRAMUSCULAR | Status: AC
  Administered 2017-02-07: 20 mL
  Filled 2017-02-07: qty 20

## 2017-02-07 MED ORDER — GABAPENTIN 300 MG PO CAPS
300.0000 mg | ORAL_CAPSULE | ORAL | Status: AC
  Administered 2017-02-07: 300 mg via ORAL
  Filled 2017-02-07: qty 1

## 2017-02-07 MED ORDER — ROCURONIUM BROMIDE 10 MG/ML (PF) SYRINGE
PREFILLED_SYRINGE | INTRAVENOUS | Status: DC | PRN
  Administered 2017-02-07: 50 mg via INTRAVENOUS

## 2017-02-07 MED ORDER — APREPITANT 40 MG PO CAPS
40.0000 mg | ORAL_CAPSULE | ORAL | Status: AC
  Administered 2017-02-07: 40 mg via ORAL
  Filled 2017-02-07: qty 1

## 2017-02-07 MED ORDER — ACETAMINOPHEN 160 MG/5ML PO SOLN
650.0000 mg | ORAL | Status: DC | PRN
  Filled 2017-02-07: qty 20.3

## 2017-02-07 MED ORDER — FENTANYL CITRATE (PF) 100 MCG/2ML IJ SOLN
INTRAMUSCULAR | Status: DC | PRN
  Administered 2017-02-07: 100 ug via INTRAVENOUS
  Administered 2017-02-07: 50 ug via INTRAVENOUS
  Administered 2017-02-07: 100 ug via INTRAVENOUS
  Administered 2017-02-07: 50 ug via INTRAVENOUS

## 2017-02-07 MED ORDER — MORPHINE SULFATE (PF) 2 MG/ML IV SOLN
1.0000 mg | INTRAVENOUS | Status: DC | PRN
  Administered 2017-02-07: 3 mg via INTRAVENOUS
  Filled 2017-02-07: qty 2

## 2017-02-07 MED ORDER — OXYCODONE HCL 5 MG/5ML PO SOLN: 5.0000 mg | Freq: Once | ORAL | Status: DC | PRN

## 2017-02-07 MED ORDER — LIDOCAINE 2% (20 MG/ML) 5 ML SYRINGE
INTRAMUSCULAR | Status: DC | PRN
  Administered 2017-02-07: 1.5 mg/kg/h via INTRAVENOUS

## 2017-02-07 MED ORDER — ONDANSETRON HCL 4 MG/2ML IJ SOLN: 4.0000 mg | Freq: Four times a day (QID) | INTRAMUSCULAR | Status: DC | PRN

## 2017-02-07 MED ORDER — EPHEDRINE SULFATE-NACL 50-0.9 MG/10ML-% IV SOSY
PREFILLED_SYRINGE | INTRAVENOUS | Status: DC | PRN
  Administered 2017-02-07: 5 mg via INTRAVENOUS

## 2017-02-07 MED ORDER — PANTOPRAZOLE SODIUM 40 MG IV SOLR
40.0000 mg | Freq: Every day | INTRAVENOUS | Status: DC
  Administered 2017-02-07: 40 mg via INTRAVENOUS
  Filled 2017-02-07: qty 40

## 2017-02-07 MED ORDER — OXYCODONE HCL 5 MG PO TABS: 5.0000 mg | ORAL_TABLET | Freq: Once | ORAL | Status: DC | PRN

## 2017-02-07 MED ORDER — 0.9 % SODIUM CHLORIDE (POUR BTL) OPTIME
TOPICAL | Status: DC | PRN
  Administered 2017-02-07: 1000 mL

## 2017-02-07 MED ORDER — ONDANSETRON HCL 4 MG/2ML IJ SOLN
INTRAMUSCULAR | Status: DC | PRN
  Administered 2017-02-07: 4 mg via INTRAVENOUS

## 2017-02-07 MED ORDER — CHLORHEXIDINE GLUCONATE 4 % EX LIQD: 60.0000 mL | Freq: Once | CUTANEOUS | Status: DC

## 2017-02-07 MED ORDER — MIDAZOLAM HCL 2 MG/2ML IJ SOLN
INTRAMUSCULAR | Status: AC
  Filled 2017-02-07: qty 2

## 2017-02-07 MED ORDER — LIDOCAINE 2% (20 MG/ML) 5 ML SYRINGE
INTRAMUSCULAR | Status: DC | PRN
  Administered 2017-02-07: 100 mg via INTRAVENOUS

## 2017-02-07 MED ORDER — LOSARTAN POTASSIUM 50 MG PO TABS
100.0000 mg | ORAL_TABLET | Freq: Every day | ORAL | Status: DC
  Administered 2017-02-08: 100 mg via ORAL
  Filled 2017-02-07 (×2): qty 2

## 2017-02-07 MED ORDER — PREMIER PROTEIN SHAKE: 2.0000 [oz_av] | ORAL | Status: DC

## 2017-02-07 MED ORDER — ACETAMINOPHEN 500 MG PO TABS
1000.0000 mg | ORAL_TABLET | ORAL | Status: AC
  Administered 2017-02-07: 1000 mg via ORAL
  Filled 2017-02-07: qty 2

## 2017-02-07 MED ORDER — LIDOCAINE 2% (20 MG/ML) 5 ML SYRINGE
INTRAMUSCULAR | Status: AC
Start: 2017-02-07 — End: 2017-02-07
  Filled 2017-02-07: qty 5

## 2017-02-07 MED ORDER — OXYCODONE HCL 5 MG/5ML PO SOLN: 5.0000 mg | ORAL | Status: DC | PRN

## 2017-02-07 MED ORDER — DEXAMETHASONE SODIUM PHOSPHATE 4 MG/ML IJ SOLN
4.0000 mg | INTRAMUSCULAR | Status: DC
Start: 2017-02-07 — End: 2017-02-07

## 2017-02-07 MED ORDER — SIMETHICONE 80 MG PO CHEW: 80.0000 mg | CHEWABLE_TABLET | Freq: Four times a day (QID) | ORAL | Status: DC | PRN

## 2017-02-07 MED ORDER — SUCCINYLCHOLINE CHLORIDE 200 MG/10ML IV SOSY
PREFILLED_SYRINGE | INTRAVENOUS | Status: DC | PRN
  Administered 2017-02-07: 160 mg via INTRAVENOUS

## 2017-02-07 MED ORDER — SUCCINYLCHOLINE CHLORIDE 200 MG/10ML IV SOSY
PREFILLED_SYRINGE | INTRAVENOUS | Status: AC
  Filled 2017-02-07: qty 10

## 2017-02-07 MED ORDER — LACTATED RINGERS IR SOLN
Status: DC | PRN
  Administered 2017-02-07: 1000 mL

## 2017-02-07 MED ORDER — HEPARIN SODIUM (PORCINE) 5000 UNIT/ML IJ SOLN
5000.0000 [IU] | INTRAMUSCULAR | Status: AC
  Administered 2017-02-07: 5000 [IU] via SUBCUTANEOUS
  Filled 2017-02-07: qty 1

## 2017-02-07 MED ORDER — BUPIVACAINE-EPINEPHRINE (PF) 0.25% -1:200000 IJ SOLN
INTRAMUSCULAR | Status: AC
  Filled 2017-02-07: qty 30

## 2017-02-07 MED ORDER — MIDAZOLAM HCL 5 MG/5ML IJ SOLN
INTRAMUSCULAR | Status: DC | PRN
  Administered 2017-02-07: 2 mg via INTRAVENOUS

## 2017-02-07 MED ORDER — HYDROMORPHONE HCL-NACL 0.5-0.9 MG/ML-% IV SOSY
0.2500 mg | PREFILLED_SYRINGE | INTRAVENOUS | Status: DC | PRN
  Administered 2017-02-07: 0.5 mg via INTRAVENOUS

## 2017-02-07 MED ORDER — DEXAMETHASONE SODIUM PHOSPHATE 10 MG/ML IJ SOLN
INTRAMUSCULAR | Status: AC
  Filled 2017-02-07: qty 1

## 2017-02-07 MED ORDER — METOPROLOL SUCCINATE ER 25 MG PO TB24
25.0000 mg | ORAL_TABLET | Freq: Every day | ORAL | Status: DC
  Administered 2017-02-08: 25 mg via ORAL
  Filled 2017-02-07: qty 1

## 2017-02-07 MED ORDER — SUGAMMADEX SODIUM 500 MG/5ML IV SOLN
INTRAVENOUS | Status: AC
  Filled 2017-02-07: qty 5

## 2017-02-07 MED ORDER — SODIUM CHLORIDE 0.9 % IV SOLN
25.0000 mg | Freq: Four times a day (QID) | INTRAVENOUS | Status: DC | PRN
  Administered 2017-02-07: 25 mg via INTRAVENOUS
  Filled 2017-02-07 (×3): qty 1

## 2017-02-07 MED ORDER — PROPOFOL 10 MG/ML IV BOLUS
INTRAVENOUS | Status: AC
  Filled 2017-02-07: qty 40

## 2017-02-07 MED ORDER — HYDRALAZINE HCL 20 MG/ML IJ SOLN: 10.0000 mg | INTRAMUSCULAR | Status: DC | PRN

## 2017-02-07 MED ORDER — SODIUM CHLORIDE 0.9 % IV SOLN
INTRAVENOUS | Status: DC
  Administered 2017-02-07: 12:00:00 via INTRAVENOUS
  Administered 2017-02-07: 1000 mL via INTRAVENOUS
  Administered 2017-02-08: 10:00:00 via INTRAVENOUS
  Administered 2017-02-08: 1000 mL via INTRAVENOUS

## 2017-02-07 MED ORDER — TRAMADOL HCL 50 MG PO TABS
50.0000 mg | ORAL_TABLET | Freq: Four times a day (QID) | ORAL | Status: DC | PRN
  Administered 2017-02-08: 50 mg via ORAL
  Filled 2017-02-07: qty 1

## 2017-02-07 SURGICAL SUPPLY — 57 items
APPLIER CLIP 5 13 M/L LIGAMAX5 (MISCELLANEOUS)
APPLIER CLIP ROT 13.4 12 LRG (CLIP)
BAG LAPAROSCOPIC 12 15 PORT 16 (BASKET) ×1 IMPLANT
BAG RETRIEVAL 12/15 (BASKET) ×2
BAG RETRIEVAL 12/15MM (BASKET) ×1
BANDAGE ADH SHEER 1  50/CT (GAUZE/BANDAGES/DRESSINGS) ×3 IMPLANT
BENZOIN TINCTURE PRP APPL 2/3 (GAUZE/BANDAGES/DRESSINGS) ×3 IMPLANT
BLADE SURG SZ11 CARB STEEL (BLADE) ×3 IMPLANT
CABLE HIGH FREQUENCY MONO STRZ (ELECTRODE) ×3 IMPLANT
CHLORAPREP W/TINT 26ML (MISCELLANEOUS) ×3 IMPLANT
CLIP APPLIE 5 13 M/L LIGAMAX5 (MISCELLANEOUS) IMPLANT
CLIP APPLIE ROT 13.4 12 LRG (CLIP) IMPLANT
CLOSURE WOUND 1/2 X4 (GAUZE/BANDAGES/DRESSINGS) ×1
COVER SURGICAL LIGHT HANDLE (MISCELLANEOUS) ×3 IMPLANT
DRAIN CHANNEL 19F RND (DRAIN) IMPLANT
ELECT REM PT RETURN 15FT ADLT (MISCELLANEOUS) ×3 IMPLANT
EVACUATOR SILICONE 100CC (DRAIN) IMPLANT
GAUZE SPONGE 4X4 12PLY STRL (GAUZE/BANDAGES/DRESSINGS) IMPLANT
GLOVE BIOGEL PI IND STRL 7.0 (GLOVE) ×1 IMPLANT
GLOVE BIOGEL PI INDICATOR 7.0 (GLOVE) ×2
GLOVE SURG SS PI 7.0 STRL IVOR (GLOVE) ×3 IMPLANT
GOWN STRL REUS W/TWL LRG LVL3 (GOWN DISPOSABLE) ×3 IMPLANT
GOWN STRL REUS W/TWL XL LVL3 (GOWN DISPOSABLE) ×9 IMPLANT
GRASPER SUT TROCAR 14GX15 (MISCELLANEOUS) ×3 IMPLANT
HANDLE STAPLE EGIA 4 XL (STAPLE) ×3 IMPLANT
HOVERMATT SINGLE USE (MISCELLANEOUS) ×3 IMPLANT
KIT BASIN OR (CUSTOM PROCEDURE TRAY) ×3 IMPLANT
MARKER SKIN DUAL TIP RULER LAB (MISCELLANEOUS) ×3 IMPLANT
NEEDLE SPNL 22GX3.5 QUINCKE BK (NEEDLE) ×3 IMPLANT
PACK CARDIOVASCULAR III (CUSTOM PROCEDURE TRAY) ×3 IMPLANT
RELOAD EGIA 45 MED/THCK PURPLE (STAPLE) IMPLANT
RELOAD EGIA 60 MED/THCK PURPLE (STAPLE) IMPLANT
RELOAD TRI 2.0 60 XTHK VAS SUL (STAPLE) IMPLANT
RELOAD TRI 60 ART MED THCK BLK (STAPLE) ×6 IMPLANT
RELOAD TRI 60 ART MED THCK PUR (STAPLE) ×12 IMPLANT
SCISSORS LAP 5X45 EPIX DISP (ENDOMECHANICALS) IMPLANT
SET IRRIG TUBING LAPAROSCOPIC (IRRIGATION / IRRIGATOR) ×3 IMPLANT
SHEARS HARMONIC ACE PLUS 45CM (MISCELLANEOUS) ×3 IMPLANT
SLEEVE GASTRECTOMY 40FR VISIGI (MISCELLANEOUS) ×3 IMPLANT
SLEEVE XCEL OPT CAN 5 100 (ENDOMECHANICALS) ×6 IMPLANT
SOLUTION ANTI FOG 6CC (MISCELLANEOUS) ×3 IMPLANT
SPONGE LAP 18X18 X RAY DECT (DISPOSABLE) ×3 IMPLANT
STRIP CLOSURE SKIN 1/2X4 (GAUZE/BANDAGES/DRESSINGS) ×2 IMPLANT
SUT ETHIBOND 0 36 GRN (SUTURE) IMPLANT
SUT ETHILON 2 0 PS N (SUTURE) IMPLANT
SUT MNCRL AB 4-0 PS2 18 (SUTURE) ×3 IMPLANT
SUT VICRYL 0 TIES 12 18 (SUTURE) ×3 IMPLANT
SYR 20CC LL (SYRINGE) ×3 IMPLANT
SYR 50ML LL SCALE MARK (SYRINGE) ×3 IMPLANT
TOWEL OR 17X26 10 PK STRL BLUE (TOWEL DISPOSABLE) ×3 IMPLANT
TOWEL OR NON WOVEN STRL DISP B (DISPOSABLE) ×3 IMPLANT
TROCAR BLADELESS 15MM (ENDOMECHANICALS) ×3 IMPLANT
TROCAR BLADELESS OPT 5 100 (ENDOMECHANICALS) ×3 IMPLANT
TUBING CONNECTING 10 (TUBING) ×2 IMPLANT
TUBING CONNECTING 10' (TUBING) ×1
TUBING ENDO SMARTCAP (MISCELLANEOUS) ×3 IMPLANT
TUBING INSUF HEATED (TUBING) ×3 IMPLANT

## 2017-02-07 NOTE — Discharge Instructions (Signed)
° ° ° °GASTRIC BYPASS/SLEEVE ° Home Care Instructions ° ° These instructions are to help you care for yourself when you go home. ° °Call: If you have any problems. °• Call [phone] and ask for the surgeon on call °• If you need immediate assistance come to the ER at [facility]. Tell the ER staff you are a new post-op gastric bypass or gastric sleeve patient  °Signs and symptoms to report: • Severe  vomiting or nausea °o If you cannot handle clear liquids for longer than 1 day, call your surgeon °• Abdominal pain which does not get better after taking your pain medication °• Fever greater than 100.4°  F and chills °• Heart rate over 100 beats a minute °• Trouble breathing °• Chest pain °• Redness,  swelling, drainage, or foul odor at incision (surgical) sites °• If your incisions open or pull apart °• Swelling or pain in calf (lower leg) °• Diarrhea (Loose bowel movements that happen often), frequent watery, uncontrolled bowel movements °• Constipation, (no bowel movements for 3 days) if this happens: °o Take Milk of Magnesia, 2 tablespoons by mouth, 3 times a day for 2 days if needed °o Stop taking Milk of Magnesia once you have had a bowel movement °o Call your doctor if constipation continues °Or °o Take Miralax  (instead of Milk of Magnesia) following the label instructions °o Stop taking Miralax once you have had a bowel movement °o Call your doctor if constipation continues °• Anything you think is “abnormal for you” °  °Normal side effects after surgery: • Unable to sleep at night or unable to concentrate °• Irritability °• Being tearful (crying) or depressed ° °These are common complaints, possibly related to your anesthesia, stress of surgery, and change in lifestyle, that usually go away a few weeks after surgery. If these feelings continue, call your medical doctor.  °Wound Care: You may have surgical glue, steri-strips, or staples over your incisions after surgery °• Surgical glue: Looks like clear  film over your incisions and will wear off a little at a time °• Steri-strips: Adhesive strips of tape over your incisions. You may notice a yellowish color on skin under the steri-strips. This is used to make the steri-strips stick better. Do not pull the steri-strips off - let them fall off °• Staples: Staples may be removed before you leave the hospital °o If you go home with staples, call [facility] for an appointment with your surgeon’s nurse to have staples removed 10 days after surgery, [phone] °• Showering: You may shower two (2) days after your surgery unless your surgeon tells you differently °o Wash gently around incisions with warm soapy water, rinse well, and gently pat dry °o If you have a drain (tube from your incision), you may need someone to hold this while you shower °o No tub baths until staples are removed and incisions are healed °  °Medications: • Medications should be liquid or crushed if larger than the size of a dime °• Extended release pills (medication that releases a little bit at a time through the  day) should not be crushed °• Depending on the size and number of medications you take, you may need to space (take a few throughout the day)/change the time you take your medications so that you do not over-fill your pouch (smaller stomach) °• Make sure you follow-up with you primary care physician to make medication changes needed during rapid weight loss and life -style changes °•   If you have diabetes, follow up with your doctor that orders your diabetes medication(s) within one week after surgery and check your blood sugar regularly ° °• Do not drive while taking narcotics (pain medications) ° °• Do not take acetaminophen (Tylenol) and Roxicet or Lortab Elixir at the same time since these pain medications contain acetaminophen °  °Diet:  °First 2 Weeks You will see the nutritionist about two (2) weeks after your surgery. The nutritionist will increase the types of  foods you can eat if you are handling liquids well: °• If you have severe vomiting or nausea and cannot handle clear liquids lasting longer than 1 day call your surgeon °Protein Shake °• Drink at least 2 ounces of shake 5-6 times per day °• Each serving of protein shakes (usually 8-12 ounces) should have a minimum of: °o 15 grams of protein °o And no more than 5 grams of carbohydrate °• Goal for protein each day: °o Men = 80 grams per day °o Women = 60 grams per day °  ° • Protein powder may be added to fluids such as non-fat milk or Lactaid milk or Soy milk (limit to 35 grams added protein powder per serving) ° °Hydration °• Slowly increase the amount of water and other clear liquids as tolerated (See Acceptable Fluids) °• Slowly increase the amount of protein shake as tolerated °• Sip fluids slowly and throughout the day °• May use sugar substitutes in small amounts (no more than 6-8 packets per day; i.e. Splenda) ° °Fluid Goal °• The first goal is to drink at least 8 ounces of protein shake/drink per day (or as directed by the nutritionist); some examples of protein shakes are Syntrax Nectar, Adkins Advantage, EAS Edge HP, and Unjury. - See handout from pre-op Bariatric Education Class: °o Slowly increase the amount of protein shake you drink as tolerated °o You may find it easier to slowly sip shakes throughout the day °o It is important to get your proteins in first °• Your fluid goal is to drink 64-100 ounces of fluid daily °o It may take a few weeks to build up to this  °• 32 oz. (or more) should be clear liquids °And °• 32 oz. (or more) should be full liquids (see below for examples) °• Liquids should not contain sugar, caffeine, or carbonation ° °Clear Liquids: °• Water of Sugar-free flavored water (i.e. Fruit H²O, Propel) °• Decaffeinated coffee or tea (sugar-free) °• Crystal lite, Wyler’s Lite, Minute Maid Lite °• Sugar-free Jell-O °• Bouillon or broth °• Sugar-free Popsicle:    - Less than 20 calories  each; Limit 1 per day ° °Full Liquids: °                  Protein Shakes/Drinks + 2 choices per day of other full liquids °• Full liquids must be: °o No More Than 12 grams of Carbs per serving °o No More Than 3 grams of Fat per serving °• Strained low-fat cream soup °• Non-Fat milk °• Fat-free Lactaid Milk °• Sugar-free yogurt (Dannon Lite & Fit, Greek yogurt) ° °  °Vitamins and Minerals • Start 1 day after surgery unless otherwise directed by your surgeon °• 2 Chewable Bariatric Multivitamin / Multimineral Supplement with iron °• Chewable Calcium Citrate with Vitamin D-3 °(Example: 3 Chewable Calcium  Plus 600 with Vitamin D-3) °o Take 500 mg three (3) times a day for a total of 1500 mg each day °o Do not take all 3 doses of calcium   at one time as it may cause constipation, and you can only absorb 500 mg at a time °o Do not mix multivitamins containing iron with calcium supplements;  take 2 hours apart °• Menstruating women and those at risk for anemia ( a blood disease that causes weakness) may need extra iron °o Talk to your doctor to see if you need more iron °• If you need extra iron: Total daily Iron recommendation (including Vitamins) is 50 to 100 mg Iron/day °• Do not stop taking or change any vitamins or minerals until you talk to your nutritionist or surgeon °• Your nutritionist and/or surgeon must approve all vitamin and mineral supplements °  °Activity and Exercise: It is important to continue walking at home. Limit your physical activity as instructed by your doctor. During this time, use these guidelines: °• Do not lift anything greater than ten  (10) pounds for at least two (2) weeks °• Do not go back to work or drive until your surgeon says you can °• You may have sex when you feel comfortable °o It is VERY important for male patients to use a reliable birth control method; fertility often increase after surgery °o Do not get pregnant for at least 18 months °• Start exercising as soon as your  doctor tells you that you can °o Make sure your doctor approves any physical activity °• Start with a simple walking program °• Walk 5-15 minutes each day, 7 days per week °• Slowly increase until you are walking 30-45 minutes per day °• Consider joining our BELT program. [phone] or email [email or web address] °  °Special Instructions Things to remember: °• Use your CPAP when sleeping if this applies to you °• Consider buying a medical alert bracelet that says you had lap-band surgery °  °  You will likely have your first fill (fluid added to your band) 6 - 8 weeks after surgery °• [facility] has a free Bariatric Surgery Support Group that meets monthly, the 3rd Thursday, 6pm. [facility] Classrooms. You can see classes online at [email or web address] °• It is very important to keep all follow up appointments with your surgeon, nutritionist, primary care physician, and behavioral health practitioner °o After the first year, please follow up with your bariatric surgeon and nutritionist at least once a year in order to maintain best weight loss results °      °             [facility]:  [phone] ° °             [facility]: [phone] ° °             Bariatric Nurse Coordinator: [phone]  °Gastric Bypass/Sleeve Home Care Instructions  Rev. 06/2012    ° °                                                    Reviewed and Endorsed °                                                   by [facility] Patient Education Committee, Jan, 2014 ° ° ° ° ° ° ° ° ° °

## 2017-02-07 NOTE — Op Note (Signed)
Preop Diagnosis: Obesity Class III  Postop Diagnosis: same  Procedure performed: laparoscopic Sleeve Gastrectomy  Assitant: Wenda Low  Indications:  The patient is a 41 y.o. year-old morbidly obese male who has been followed in the Bariatric Clinic as an outpatient. This patient was diagnosed with morbid obesity with a BMI of Body mass index is 50.9 kg/m. and significant co-morbidities including hypertension.  The patient was counseled extensively in the Bariatric Outpatient Clinic and after a thorough explanation of the risks and benefits of surgery (including death from complications, bowel leak, infection such as peritonitis and/or sepsis, internal hernia, bleeding, need for blood transfusion, bowel obstruction, organ failure, pulmonary embolus, deep venous thrombosis, wound infection, incisional hernia, skin breakdown, and others entailed on the consent form) and after a compliant diet and exercise program, the patient was scheduled for an elective laparoscopic sleeve gastrectomy.  Description of Operation:  Following informed consent, the patient was taken to the operating room and placed on the operating table in the supine position.  He had previously received prophylactic antibiotics and subcutaneous heparin for DVT prophylaxis in the pre-op holding area.  After induction of general endotracheal anesthesia by the anesthesiologist, the patient underwent placement of sequential compression devices, Foley catheter and an oro-gastric tube.  A timeout was confirmed by the surgery and anesthesia teams.  The patient was adequately padded at all pressure points and placed on a footboard to prevent slippage from the OR table during extremes of position during surgery.  He underwent a routine sterile prep and drape of her entire abdomen.    Next, A transverse incision was made under the left subcostal area and a 5mm optical viewing trocar was introduced into the peritoneal cavity. Pneumoperitoneum was  applied with a high flow and low pressure. A laparoscope was inserted to confirm placement. A extraperitoneal block was then placed at the lateral abdominal wall using exparel diluted with marcaine. 5 additional trocars were placed: 1 5mm trocar to the left of the midline. 1 additional 5mm trocar in the left lateral area, 1 12mm trocar in the right mid abdomen, and 1 5mm trocar in the right subcostal area.  The fat pad at the GE junction was incised and the gastrodiaphragmatic ligament was divided using the Harmonic scalpel. Next, a hole was created through the lesser omentum along the greater curve of the stomach to enter the lesser sac. The vessels along the greater omentum were  Then ligated and divided using the Harmonic scalpel moving towards the spleen and then short gastric vessels were ligated and divided in the same fashion to fully mobilize the fundus. The left crus was identified to ensure completion of the dissection. Next the antrum was measured and dissection continued inferiorly along the greater curve towards the pylorus and stopped 6cm from the pylorus.   A 40Fr ViSiGi dilator was placed into the esophgaus and along the lesser curve of the stomach and placed on suction. 2 non-reinforced 60mm 4-79mm tristapler(s) followed by 4 60mm 3-47mm tristaplers were used to make the resection along the antrum being sure to stay well away from the angularis by angling the jaws of the stapler towards the greater curve and later completing the resection staying along the ViSiGi and ensuring the fundus was not retained by appropriately retracting it lateral. Air was inserted through the ViSiGi to perform a leak test showing no bubbles and a neutral lie of the stomach.  The assistant then went and performed an upper endoscopy and leak test. No bubbles were  seen and the sleeve and antrum distended appropriately. The specimen was then placed in an endocatch bag and removed by the 15mm port. The fascia of the 15mm  port was closed with a 0 vicryl by suture passer. Hemostasis was ensured, cautery was used in one location of the staple line. Pneumoperitoneum was evacuated, all ports were removed and all incisions closed with 4-0 monocryl suture in subcuticular fashion. Steristrips and bandaids were put in place for dressing. The patient awoke from anesthesia and was brought to pacu in stable condition. All counts were correct.  Estimated blood loss: <2ml  Specimens:  Sleeve gastrectomy  Local Anesthesia: 50 ml Exparel:0.5% Marcaine mix  Post-Op Plan:       Pain Management: PO, prn      Antibiotics: Prophylactic      Anticoagulation: Prophylactic, Starting now      Post Op Studies/Consults: Not applicable      Intended Discharge: within 48h      Intended Outpatient Follow-Up: Two Week      Intended Outpatient Studies: Not Applicable      Other: Not Applicable   [place]

## 2017-02-07 NOTE — Anesthesia Preprocedure Evaluation (Signed)
Anesthesia Evaluation  Patient identified by MRN, date of birth, ID band Patient awake    Reviewed: Allergy & Precautions, H&P , NPO status , Patient's Chart, lab work & pertinent test results  History of Anesthesia Complications (+) PONV and history of anesthetic complications  Airway Mallampati: II   Neck ROM: full    Dental   Pulmonary sleep apnea ,    breath sounds clear to auscultation       Cardiovascular hypertension,  Rhythm:regular Rate:Normal     Neuro/Psych PSYCHIATRIC DISORDERS Anxiety Depression    GI/Hepatic GERD  ,  Endo/Other  Morbid obesity  Renal/GU      Musculoskeletal  (+) Arthritis ,   Abdominal   Peds  Hematology   Anesthesia Other Findings   Reproductive/Obstetrics                             Anesthesia Physical Anesthesia Plan  ASA: II  Anesthesia Plan: General   Post-op Pain Management:    Induction: Intravenous  PONV Risk Score and Plan: 3 and Ondansetron, Dexamethasone, Midazolam, Treatment may vary due to age or medical condition and Scopolamine patch - Pre-op  Airway Management Planned: Oral ETT  Additional Equipment:   Intra-op Plan:   Post-operative Plan: Extubation in OR  Informed Consent: I have reviewed the patients History and Physical, chart, labs and discussed the procedure including the risks, benefits and alternatives for the proposed anesthesia with the patient or authorized representative who has indicated his/her understanding and acceptance.     Plan Discussed with: CRNA, Anesthesiologist and Surgeon  Anesthesia Plan Comments:         Anesthesia Quick Evaluation

## 2017-02-07 NOTE — Anesthesia Procedure Notes (Signed)
Procedure Name: Intubation Date/Time: 02/07/2017 7:21 AM Performed by: Lind Covert Pre-anesthesia Checklist: Patient identified, Emergency Drugs available, Suction available, Patient being monitored and Timeout performed Patient Re-evaluated:Patient Re-evaluated prior to induction Oxygen Delivery Method: Circle system utilized Preoxygenation: Pre-oxygenation with 100% oxygen Induction Type: IV induction Ventilation: Mask ventilation without difficulty Laryngoscope Size: Mac and 5 Grade View: Grade II Tube type: Oral Tube size: 8.0 mm Number of attempts: 1 Airway Equipment and Method: Stylet Placement Confirmation: ETT inserted through vocal cords under direct vision,  positive ETCO2 and breath sounds checked- equal and bilateral Secured at: 23 cm Tube secured with: Tape Dental Injury: Teeth and Oropharynx as per pre-operative assessment

## 2017-02-07 NOTE — Op Note (Signed)
DIONTRE HARPS [id number] December 19, 1975 02/07/2017  Preoperative diagnosis: morbid obesity   Postoperative diagnosis: Same   Procedure: Upper endoscopy   Surgeon: Susy Frizzle B. Daphine Deutscher  M.D., FACS   Anesthesia: Gen.   Indications for procedure: This patient was undergoing a sleeve gastrectomy.    Description of procedure: The endoscopy was placed in the mouth and into the oropharynx and under endoscopic vision it was advanced to the esophagogastric junction.  The pouch was insufflated and the sleeve gastrectomy was inspected.  Cylindrical pouch was noted.  The patient is 6'9" and the EG junction was > 50 cm from the teeth.  .   No bleeding or leaks were detected.  The scope was withdrawn without difficulty.     Matt B. Daphine Deutscher, MD, FACS General, Bariatric, & Minimally Invasive Surgery [facility], [place]

## 2017-02-07 NOTE — Interval H&P Note (Signed)
History and Physical Interval Note:  02/07/2017 6:55 AM  Robert Stevenson  has presented today for surgery, with the diagnosis of MORBID OBESITY  The various methods of treatment have been discussed with the patient and family. After consideration of risks, benefits and other options for treatment, the patient has consented to  Procedure(s): LAPAROSCOPIC GASTRIC SLEEVE RESECTION WITH UPPER ENDO (N/A) as a surgical intervention .  The patient's history has been reviewed, patient examined, no change in status, stable for surgery.  I have reviewed the patient's chart and labs.  Questions were answered to the patient's satisfaction.     [name]

## 2017-02-07 NOTE — Transfer of Care (Signed)
Immediate Anesthesia Transfer of Care Note  Patient: Robert Stevenson  Procedure(s) Performed: LAPAROSCOPIC GASTRIC SLEEVE RESECTION WITH UPPER ENDO (N/A Abdomen)  Patient Location: PACU  Anesthesia Type:General  Level of Consciousness: sedated  Airway & Oxygen Therapy: Patient Spontanous Breathing and Patient connected to face mask oxygen  Post-op Assessment: Report given to RN and Post -op Vital signs reviewed and stable  Post vital signs: Reviewed and stable  Last Vitals:  Vitals:   02/07/17 0518  BP: (!) 157/87  Pulse: 85  Resp: 18  Temp: 36.8 C  SpO2: 97%    Last Pain:  Vitals:   02/07/17 0518  TempSrc: Oral      Patients Stated Pain Goal: 4 (02/07/17 0548)  Complications: No apparent anesthesia complications

## 2017-02-07 NOTE — Anesthesia Postprocedure Evaluation (Signed)
Anesthesia Post Note  Patient: Robert Stevenson  Procedure(s) Performed: LAPAROSCOPIC GASTRIC SLEEVE RESECTION WITH UPPER ENDO (N/A Abdomen)     Patient location during evaluation: PACU Anesthesia Type: General Level of consciousness: awake and alert Pain management: pain level controlled Vital Signs Assessment: post-procedure vital signs reviewed and stable Respiratory status: spontaneous breathing, nonlabored ventilation, respiratory function stable and patient connected to nasal cannula oxygen Cardiovascular status: blood pressure returned to baseline and stable Postop Assessment: no apparent nausea or vomiting Anesthetic complications: no    Last Vitals:  Vitals:   02/07/17 0945 02/07/17 1015  BP: (!) 162/85   Pulse:    Resp:    Temp:    SpO2: 94% 94%    Last Pain:  Vitals:   02/07/17 1015  TempSrc:   PainSc: Asleep                 Carmencita Cusic S

## 2017-02-08 ENCOUNTER — Encounter (HOSPITAL_COMMUNITY): Payer: Self-pay | Admitting: General Surgery

## 2017-02-08 LAB — COMPREHENSIVE METABOLIC PANEL
ALK PHOS: 58 U/L (ref 38–126)
ALT: 22 U/L (ref 17–63)
ANION GAP: 9 (ref 5–15)
AST: 24 U/L (ref 15–41)
Albumin: 3.3 g/dL — ABNORMAL LOW (ref 3.5–5.0)
BILIRUBIN TOTAL: 0.5 mg/dL (ref 0.3–1.2)
BUN: 11 mg/dL (ref 6–20)
CALCIUM: 8.6 mg/dL — AB (ref 8.9–10.3)
CO2: 25 mmol/L (ref 22–32)
Chloride: 106 mmol/L (ref 101–111)
Creatinine, Ser: 0.97 mg/dL (ref 0.61–1.24)
GFR calc Af Amer: >60 mL/min (ref >60)
Glucose, Bld: 98 mg/dL (ref 65–99)
POTASSIUM: 3.7 mmol/L (ref 3.5–5.1)
Sodium: 140 mmol/L (ref 135–145)
TOTAL PROTEIN: 7 g/dL (ref 6.5–8.1)

## 2017-02-08 LAB — CBC WITH DIFFERENTIAL/PLATELET
BASOS ABS: 0 10*3/uL (ref 0.0–0.1)
BASOS PCT: 0 %
EOS PCT: 0 %
Eosinophils Absolute: 0 10*3/uL (ref 0.0–0.7)
HCT: 38.1 % — ABNORMAL LOW (ref 39.0–52.0)
Hemoglobin: 12.4 g/dL — ABNORMAL LOW (ref 13.0–17.0)
Lymphocytes Relative: 22 %
Lymphs Abs: 1.7 10*3/uL (ref 0.7–4.0)
MCH: 26.1 pg (ref 26.0–34.0)
MCHC: 32.5 g/dL (ref 30.0–36.0)
MCV: 80 fL (ref 78.0–100.0)
MONO ABS: 0.7 10*3/uL (ref 0.1–1.0)
Monocytes Relative: 9 %
NEUTROS ABS: 5.2 10*3/uL (ref 1.7–7.7)
Neutrophils Relative %: 69 %
Platelets: 270 10*3/uL (ref 150–400)
RBC: 4.76 MIL/uL (ref 4.22–5.81)
RDW: 15.3 % (ref 11.5–15.5)
WBC: 7.6 10*3/uL (ref 4.0–10.5)

## 2017-02-08 MED ORDER — ONDANSETRON 4 MG PO TBDP: 4.0000 mg | ORAL_TABLET | Freq: Three times a day (TID) | ORAL | 0 refills | Status: DC | PRN

## 2017-02-08 MED ORDER — BUPIVACAINE HCL (PF) 0.25 % IJ SOLN
INTRAMUSCULAR | Status: AC
  Filled 2017-02-08: qty 30

## 2017-02-08 MED ORDER — CHLORPROMAZINE HCL 10 MG PO TABS: 10.0000 mg | ORAL_TABLET | Freq: Four times a day (QID) | ORAL | 0 refills | Status: DC | PRN

## 2017-02-08 MED FILL — CHLORPROMAZINE 10 MG TABLET: 10 | 5 days supply | Qty: 20 | Fill #0

## 2017-02-08 MED FILL — ONDANSETRON ODT 4 MG TABLET: 4 | 6 days supply | Qty: 20 | Fill #0

## 2017-02-08 NOTE — Progress Notes (Signed)
RT placed patient on cpap. Patient setting is 8-20 cmH2O with 2 liter bleed in. Patient is tolerating well.

## 2017-02-08 NOTE — Discharge Summary (Addendum)
Physician Discharge Summary  Robert Stevenson GNF:[id number] DOB: 11-May-1975 DOA: 02/07/2017  PCP: Loyal Jacobson, MD  Admit date: 02/07/2017 Discharge date: 02/08/2017  Recommendations for Outpatient Follow-up:  1.  (include homehealth, outpatient follow-up instructions, specific recommendations for PCP to follow-up on, etc.)  Follow-up Information    Weslie Pretlow, De Blanch, MD. Go on 02/22/2017.   Specialty:  General Surgery Why:  at 0145 Contact information: [address] [place] [address] [place] [address] [phone]        Frans Valente, De Blanch, MD Follow up.   Specialty:  General Surgery Contact information: [address] [place] [address] [place] [address] [phone]          Discharge Diagnoses:  Active Problems:   Morbid obesity (HCC)   Surgical Procedure: Laparoscopic Sleeve Gastrectomy, upper endoscopy  Discharge Condition: Good Disposition: Home  Diet recommendation: Postoperative sleeve gastrectomy diet (liquids only)  Filed Weights   02/07/17 0519  Weight: (!) 215.5 kg (475 lb)     Hospital Course:  The patient was admitted after undergoing laparoscopic sleeve gastrectomy. POD 0 he ambulated well. POD 1 he was started on the water diet protocol and tolerated 250 ml in the first shift. He had some hiccups treated with thorazine with improvement. Once meeting the water amount he was advanced to bariatric protein shakes which they tolerated and were discharged home POD 1.  Treatments: surgery: laparoscopic sleeve gastrectomy  Discharge Instructions  Discharge Instructions    AMB Referral to [facility] Care Management    Complete by:  As directed    Please assign UMR member for post discharge call. Currently at [facility]. Thanks.  Raiford Noble, MSN-Ed, RN,BSN-THN Care [facility] [phone]   Reason for consult:  Please assign UMR member for post discharge call   Expected date of contact:  1-3 days (reserved for hospital discharges)   Ambulate hourly while awake    Complete by:  As directed    Call MD for:  difficulty breathing, headache or visual disturbances    Complete by:  As directed    Call MD for:  persistant dizziness or light-headedness    Complete by:  As directed    Call MD for:  persistant nausea and vomiting    Complete by:  As directed    Call MD for:  redness, tenderness, or signs of infection (pain, swelling, redness, odor or green/yellow discharge around incision site)    Complete by:  As directed    Call MD for:  severe uncontrolled pain    Complete by:  As directed    Call MD for:  temperature >101 F    Complete by:  As directed    Diet bariatric full liquid    Complete by:  As directed    Discharge wound care:    Complete by:  As directed    Remove Bandaids tomorrow, ok to shower tomorrow. Steristrips may fall off in 1-3 weeks.   Incentive spirometry    Complete by:  As directed    Perform hourly while awake     Allergies as of 02/08/2017   No Known Allergies     Medication List    STOP taking these medications   celecoxib 200 MG capsule Commonly known as:  CELEBREX     TAKE these medications   atorvastatin 20 MG tablet Commonly known as:  LIPITOR Take 20 mg by mouth every morning.   chlorproMAZINE 10 MG tablet Commonly known as:  THORAZINE Take 1 tablet (10  mg total) by mouth 4 (four) times daily as needed.   escitalopram 10 MG tablet Commonly known as:  LEXAPRO Take 10 mg by mouth daily.   losartan 100 MG tablet Commonly known as:  COZAAR Take 100 mg by mouth daily. Notes to patient:  Monitor Blood Pressure Daily and keep a log for primary care physician.  You may need to make changes to your medications with rapid weight loss.     metoprolol succinate 25 MG 24 hr tablet Commonly known as:  TOPROL-XL Take 25 mg by mouth daily. Notes to patient:  Monitor Blood Pressure Daily and keep a log for primary care physician.  You may need to make changes to your medications with  rapid weight loss.     multivitamin tablet Take 1 tablet by mouth daily.   ondansetron 4 MG disintegrating tablet Commonly known as:  ZOFRAN ODT Take 1 tablet (4 mg total) by mouth every 8 (eight) hours as needed for nausea or vomiting.            Discharge Care Instructions        Start     Ordered   02/08/17 0000  Discharge wound care:    Comments:  Remove Bandaids tomorrow, ok to shower tomorrow. Steristrips may fall off in 1-3 weeks.   02/08/17 1455     Follow-up Information    Shaletta Hinostroza, De Blanch, MD. Go on 02/22/2017.   Specialty:  General Surgery Why:  at 0145 Contact information: [address] [place] [address] [place] [address] [phone]        Shagun Wordell, De Blanch, MD Follow up.   Specialty:  General Surgery Contact information: [address] [place] [address] [place] [address] [phone]            The results of significant diagnostics from this hospitalization (including imaging, microbiology, ancillary and laboratory) are listed below for reference.    Significant Diagnostic Studies: No results found.  Labs: Basic Metabolic Panel:  Recent Labs Lab 02/07/17 1445 02/08/17 0556  NA  --  140  K  --  3.7  CL  --  106  CO2  --  25  GLUCOSE  --  98  BUN  --  11  CREATININE 0.94 0.97  CALCIUM  --  8.6*   Liver Function Tests:  Recent Labs Lab 02/08/17 0556  AST 24  ALT 22  ALKPHOS 58  BILITOT 0.5  PROT 7.0  ALBUMIN 3.3*    CBC:  Recent Labs Lab 02/07/17 1117 02/07/17 1445 02/08/17 0556  WBC  --  9.0 7.6  NEUTROABS  --   --  5.2  HGB 13.0 13.1 12.4*  HCT 39.9 40.2 38.1*  MCV  --  80.7 80.0  PLT  --  256 270    CBG: No results for input(s): GLUCAP in the last 168 hours.  Active Problems:   Morbid obesity (HCC)   Time coordinating discharge: <57min

## 2017-02-08 NOTE — Progress Notes (Signed)
Pt was alert, oriented, ambulating and tolerating diet. D/C instructions were given, all questions answered.

## 2017-02-08 NOTE — Progress Notes (Signed)
Patient alert and oriented, Post op day 1.  Provided support and encouragement.  Encouraged pulmonary toilet, ambulation and small sips of liquids.  Completed 12 ounces of clear fluid started protein shakes. All questions answered.  Will continue to monitor. 

## 2017-02-08 NOTE — Consult Note (Addendum)
   [facility] CM Inpatient Consult   02/08/2017  DONTEA CORLEW 1976-04-30 [id number]   Went to bedside to speak with Mr. Burchill on behalf of [facility] Care Management/Link to Wellness program on behalf of [place] employees/dependents with [facility] insurance.   He was resting soundly and comfortably upon bedside visit. Did not disturb. Family present.   Left brochure, 24-hr nurse line magnet, and contact information at bedside.    Raiford Noble, MSN-Ed, RN,BSN [facility] Liaison [phone]

## 2017-02-08 NOTE — Progress Notes (Signed)
Patient alert and oriented, pain is controlled. Patient is tolerating fluids, advanced to protein shake today, patient is tolerating well.  Reviewed Gastric sleeve discharge instructions with patient and patient is able to articulate understanding.  Provided information on BELT program, Support Group and [facility] outpatient pharmacy. All questions answered, will continue to monitor.  

## 2017-02-09 ENCOUNTER — Encounter: Payer: Self-pay | Admitting: *Deleted

## 2017-02-09 ENCOUNTER — Other Ambulatory Visit: Payer: Self-pay | Admitting: *Deleted

## 2017-02-09 NOTE — Patient Outreach (Signed)
Completed transition of care outreach. See transition of care template for details. Linc had laparoscopic sleeve gastrectomy and upper endoscopy on 02/07/18 by Dr. Sheliah Hatch and Dr. Daphine Deutscher. He was discharged on 02/08/17. No care management needs identified so will mail successful outreach letter and close patient to [facility] CM services.  Bary Richard RN,CCM,CDE [facility] Care Management Coordinator Link To Wellness and [employer] [phone] Office Fax [phone]

## 2017-02-11 ENCOUNTER — Telehealth (HOSPITAL_COMMUNITY): Payer: Self-pay

## 2017-02-11 NOTE — Telephone Encounter (Signed)
Made discharge phone call to patient. Asking the following questions.    1. Do you have someone to care for you now that you are home?  independent 2. Are you having pain now that is not relieved by your pain medication?  No pain meds needed 3. Are you able to drink the recommended daily amount of fluids (48 ounces minimum/day) and protein (60-80 grams/day) as prescribed by the dietitian or nutritional counselor?  60 ounces of fluid, 40 grams of protein 4. Are you taking the vitamins and minerals as prescribed?  yes 5. Do you have the "on call" number to contact your surgeon if you have a problem or question?  yes 6. Are your incisions free of redness, swelling or drainage? (If steri strips, address that these can fall off, shower as tolerated) yes 7. Have your bowels moved since your surgery?  If not, are you passing gas?  yes 8. Are you up and walking 3-4 times per day?  yes 9. Were you provided your discharge medications before your surgery or before you were discharged from the hospital and are you taking them without problem?  yes

## 2017-02-22 ENCOUNTER — Encounter: Payer: 59 | Attending: General Surgery | Admitting: Skilled Nursing Facility1

## 2017-02-22 DIAGNOSIS — Z713 Dietary counseling and surveillance: Secondary | ICD-10-CM | POA: Diagnosis not present

## 2017-02-24 ENCOUNTER — Encounter: Payer: Self-pay | Admitting: Skilled Nursing Facility1

## 2017-02-24 NOTE — Progress Notes (Signed)
Bariatric Class:  Appt start time: 1530 end time:  1630.  2 Week Post-Operative Nutrition Class  Patient was seen on 02/24/2017 for [facility]ative [facility]   Surgery date: 02/07/2017 Surgery type: sleeve gastrectomy Start weight at [facility]: 471 Weight today: Robert Stevenson RESULTS  02/24/2017   BMI (kg/m^2) 47.1   Fat Mass (lbs) 217.4   Fat Free Mass (lbs) 222.6   Total Body Water (lbs) N/A   The following the learning objectives were met by the patient during this course:  Identifies Phase 3A (Soft, High Proteins) Dietary Goals and will begin from 2 weeks post-operatively to 2 months post-operatively  Identifies appropriate sources of fluids and proteins   States protein recommendations and appropriate sources post-operatively  Identifies the need for appropriate texture modifications, mastication, and bite sizes when consuming solids  Identifies appropriate multivitamin and calcium sources post-operatively  Describes the need for physical activity post-operatively and will follow MD recommendations  States when to call healthcare provider regarding medication questions or post-operative complications  Handouts given during class include:  Phase 3A: Soft, High Protein Diet Handout  Follow-Up Plan: Patient will follow-up at [facility] in 6 weeks for 2 month post-op nutrition visit for diet advancement per MD.

## 2017-03-10 DIAGNOSIS — I10 Essential (primary) hypertension: Secondary | ICD-10-CM | POA: Diagnosis not present

## 2017-03-10 DIAGNOSIS — Q8740 Marfan's syndrome, unspecified: Secondary | ICD-10-CM | POA: Diagnosis not present

## 2017-03-10 DIAGNOSIS — Z9889 Other specified postprocedural states: Secondary | ICD-10-CM | POA: Diagnosis not present

## 2017-03-10 DIAGNOSIS — R600 Localized edema: Secondary | ICD-10-CM | POA: Diagnosis not present

## 2017-03-15 MED FILL — METOPROLOL SUCC ER 25 MG TA: 25 | 90 days supply | Qty: 90 | Fill #0

## 2017-04-04 ENCOUNTER — Encounter: Payer: 59 | Attending: General Surgery | Admitting: Skilled Nursing Facility1

## 2017-04-04 ENCOUNTER — Encounter: Payer: Self-pay | Admitting: Skilled Nursing Facility1

## 2017-04-04 DIAGNOSIS — Z713 Dietary counseling and surveillance: Secondary | ICD-10-CM | POA: Insufficient documentation

## 2017-04-04 NOTE — Patient Instructions (Addendum)
-  Try to eat about an hour after waking  -Aim to eat every 3-5 hours  -Start taking the Opurity capsule  -Keep up the exercise! Great Job!  -Try the kefir

## 2017-04-04 NOTE — Progress Notes (Signed)
  Follow-up visit:  8 Weeks Post-Operative Sleeve Surgery  Primary concerns today: Post-operative Bariatric Surgery Nutrition Management.   Pt states it is still shocking to eat so little. Pt states he is eating popcorn. 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 Pt states he is  working on paying attention to how much and how fast he is eating and drinking. Pt states he notices caffeine gives him an all in one moment pack of energy. Pt states he is noticing he feels Cold coming in waves. Pt states his Niece cooks for him. Pts Labs: hemoglobin 12.4 HCT 38.1   Surgery date: 02/07/2017 Surgery type: sleeve gastrectomy [facility] Weight today: 418 Wt Change: 22  TANITA  BODY COMP RESULTS  02/24/2017 04/04/2017   BMI (kg/m^2) 47.1 44.8   Fat Mass (lbs) 217.4 161.4   Fat Free Mass (lbs) 222.6 256.8   Total Body Water (lbs) N/A 184.4    24-hr recall: B (AM): 2 boiled eggs or bacon and eggs Snk (AM):  L (PM): 3 pieces of bacon----skipped Snk (PM): popcorn D (PM): soup with meat and vegetables----5 ounce steak with mushrooms  Snk (PM): soup with meat and vegetables---4 ounce steak with mushrooms   Fluid intake: water, sugar free flavorings, bai drinks: 70-80 oz Estimated total protein intake: 70+  Medications: See List Supplementation: gagging on the multivitamin and taking tums and vitamin E  Using straws: no Drinking while eating: no Having you been chewing well: yes Chewing/swallowing difficulties: no Changes in vision: no Changes to mood/headaches: no Hair loss/Cahnges to skin/Changes to nails: no Any difficulty focusing or concentrating: no Sweating: no Dizziness/Lightheaded: no Palpitations: on  Carbonated beverages: no N/V/D/C/GAS: going a week without a movement sometimes: trying to eat more beans but the taste got old fast Abdominal Pain: no Dumping syndrome: no  Recent physical activity:  App on phone avoiding pressure  on left knee, 30 minutes 2-3 days a week   Progress Towards Goal(s):  In progress.  Handouts given during visit include:  Non-starchy vegetables    Nutritional Diagnosis:  Robert Stevenson-3.3 Overweight/obesity related to past poor dietary habits and physical inactivity as evidenced by patient w/ recent sleeve surgery following dietary guidelines for continued weight loss.    Intervention:  Nutrition counseling. Dietitian educated th ept on advancing his diet to include non-starchy vegetables + protein. Goals: -Try to eat about an hour after waking -Aim to eat every 3-5 hours -Start taking the Opurity capsule: stop taking the vitamin E -Keep up the exercise! Great Job! -Try the kefir  Teaching Method Utilized:  Visual Auditory Hands on  Barriers to learning/adherence to lifestyle change: none identified   Demonstrated degree of understanding via:  Teach Back   Monitoring/Evaluation:  Dietary intake, exercise, and body weight.

## 2017-04-06 DIAGNOSIS — Z9884 Bariatric surgery status: Secondary | ICD-10-CM | POA: Diagnosis not present

## 2017-04-06 DIAGNOSIS — R69 Illness, unspecified: Secondary | ICD-10-CM | POA: Diagnosis not present

## 2017-04-06 DIAGNOSIS — E669 Obesity, unspecified: Secondary | ICD-10-CM | POA: Diagnosis not present

## 2017-04-06 DIAGNOSIS — K912 Postsurgical malabsorption, not elsewhere classified: Secondary | ICD-10-CM | POA: Diagnosis not present

## 2017-04-18 MED FILL — LOSARTAN POTASSIUM 100 MG T: 100 | 90 days supply | Qty: 90 | Fill #2 | Status: TO

## 2017-05-13 MED FILL — ATORVASTATIN 20 MG TABLET: 20 | 90 days supply | Qty: 90 | Fill #0

## 2017-05-13 MED FILL — ESCITALOPRAM 10 MG TABLET: 10 | 90 days supply | Qty: 90 | Fill #0

## 2017-05-16 DIAGNOSIS — Q8740 Marfan's syndrome, unspecified: Secondary | ICD-10-CM | POA: Diagnosis not present

## 2017-05-16 DIAGNOSIS — R600 Localized edema: Secondary | ICD-10-CM | POA: Diagnosis not present

## 2017-05-16 DIAGNOSIS — Z903 Acquired absence of stomach [part of]: Secondary | ICD-10-CM | POA: Diagnosis not present

## 2017-05-16 DIAGNOSIS — I10 Essential (primary) hypertension: Secondary | ICD-10-CM | POA: Diagnosis not present

## 2017-06-13 ENCOUNTER — Encounter: Payer: 59 | Attending: General Surgery | Admitting: Skilled Nursing Facility1

## 2017-06-13 ENCOUNTER — Encounter: Payer: Self-pay | Admitting: Skilled Nursing Facility1

## 2017-06-13 DIAGNOSIS — Z713 Dietary counseling and surveillance: Secondary | ICD-10-CM | POA: Diagnosis not present

## 2017-06-13 NOTE — Progress Notes (Signed)
Post-Operative Sleeve Surgery  Primary concerns today: Post-operative Bariatric Surgery Nutrition Management.   Pt states it is still shocking to eat so little. Pt states he is eating popcorn.  Pt states he has been feeling a little tired and has been adjusted to the fact that he is tried and needs to eat. Pt states he is still working on adding in exercising. 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 Pt staets he has been working on Limiting carbs not eliminating them looking for 8-10g. Pt states medicines on an empty stomach do not feel good. Pt states  He is still eating out but making completely different choices. 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 Pt states he has Gotten over not wanting to workout trying to be more active while at work. Pt states he No longer has knee pain. Pt states he did not like the kefir.   Surgery date: 02/07/2017 Surgery type: sleeve gastrectomy [facility] Weight today: 387 Wt Change: 31  TANITA  BODY COMP RESULTS  02/24/2017 04/04/2017 06/13/2017   BMI (kg/m^2) 47.1 44.8 41.5   Fat Mass (lbs) 217.4 161.4 139   Fat Free Mass (lbs) 222.6 256.8 248   Total Body Water (lbs) N/A 184.4 174    24-hr recall: B (AM): 2 boiled eggs or bacon and eggs---8 oz of skim fairlife milk Snk (AM):  L (PM): 3 pieces of bacon----skipped Snk (PM): popcorn D (PM): soup with meat and vegetables----5 ounce steak with mushrooms or meatballs and sausage links Snk (PM): soup with meat and vegetables---4 ounce steak with mushrooms or 2 wings and bowl of soup  Fluid intake: water, sugar free flavorings, bai drinks: 64oz Estimated total protein intake: 70+  Medications: See List Supplementation: flinstones vitamins but will switch to opurity capsules   Using straws: no Drinking while eating: yes Having you been chewing well: yes Chewing/swallowing difficulties: no Changes in vision:  no Changes to mood/headaches: no Hair loss/Cahnges to skin/Changes to nails: no Any difficulty focusing or concentrating: no Sweating: no Dizziness/Lightheaded: no Palpitations: on  Carbonated beverages: no N/V/D/C/GAS: once a week bowel movement  Abdominal Pain: no Dumping syndrome: no  Recent physical activity:  App on phone avoiding pressure on left knee, 30 minutes 2-3 days a week   Progress Towards Goal(s):  In progress.  Handouts given during visit include:  Non-starchy vegetables    Nutritional Diagnosis:  [place]-3.3 Overweight/obesity related to past poor dietary habits and physical inactivity as evidenced by patient w/ recent sleeve surgery following dietary guidelines for continued weight loss.    Intervention:  Nutrition counseling. Dietitian educated th ept on advancing his diet to include starchy vegetables + protein. Goals: -Try to eat about an hour after waking -Aim to eat every 3-5 hours -Start taking the Opurity capsule -Keep up the exercise! Great Job! -Aim for at least 70 ounces of fluid -Aim to eat 4-5 ounces of protein leaving room for vegetables: do not sacrifice your protein/non-starchy vegetables for starchy vegetables  Teaching Method Utilized:  Visual Auditory Hands on  Barriers to learning/adherence to lifestyle change: none identified   Demonstrated degree of understanding via:  Teach Back   Monitoring/Evaluation:  Dietary intake, exercise, and body weight.

## 2017-06-27 DIAGNOSIS — M159 Polyosteoarthritis, unspecified: Secondary | ICD-10-CM | POA: Insufficient documentation

## 2017-06-28 DIAGNOSIS — R718 Other abnormality of red blood cells: Secondary | ICD-10-CM | POA: Insufficient documentation

## 2017-09-05 ENCOUNTER — Encounter: Payer: Self-pay | Admitting: Skilled Nursing Facility1

## 2017-09-05 ENCOUNTER — Encounter: Payer: Managed Care, Other (non HMO) | Attending: General Surgery | Admitting: Skilled Nursing Facility1

## 2017-09-05 DIAGNOSIS — Z713 Dietary counseling and surveillance: Secondary | ICD-10-CM | POA: Insufficient documentation

## 2017-09-05 NOTE — Progress Notes (Signed)
Post-Operative Sleeve Surgery  Primary concerns today: Post-operative Bariatric Surgery Nutrition Management.  Pt states he feels like his weight is plateuing and is working on accepting that fact. Pt states he is paying attention to how he snacks and what he chooses to drink. Pt states his goal is to drink more water. 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 Pt states walking is his thing and does not like the gym. Pt states he went Walking at the science center and was completely fine not feeling like he had to recuperate. Pt states his next big goal is to be under 300 pounds. Pt states he likes spicy again.  Pt states he is working on [employer] groups and accepting that this is a long term journey. Pt states he has not been taking his calcium just because he does not take them.   Surgery date: 02/07/2017 Surgery type: sleeve gastrectomy [facility] weight at [facility]: 471 Weight today: 366 Wt Change: 21  TANITA  BODY COMP RESULTS  02/24/2017 04/04/2017 06/13/2017 09/05/2017   BMI (kg/m^2) 47.1 44.8 41.5 39.3   Fat Mass (lbs) 217.4 161.4 139 130   Fat Free Mass (lbs) 222.6 256.8 248 236.4   Total Body Water (lbs) N/A 184.4 174 165    24-hr recall: B (AM): atkins bar with medication and 6-8 oz cup of fairlife skim milk Snk (AM): popcornor chips with higher protein and fiber L (PM): mixed nuts and beef jerky Snk (PM): 3 grilled wings D (PM): 3 grilled wings Snk (7-8 PM): nachos and frozen yogurt with nuts Fluid intake: water, sugar free twist with unsweet tea, hot chai tea with soy milk, diet soda, sugar free flavorings, bai drinks: 64oz Estimated total protein intake: 70+  Medications: See List Supplementation: opurity capsules and no calcium   Using straws: no Drinking while eating: yes Having you been chewing well: yes Chewing/swallowing difficulties: no Changes in vision: no Changes to mood/headaches: no Hair loss/Cahnges  to skin/Changes to nails: no Any difficulty focusing or concentrating: no Sweating: no Dizziness/Lightheaded: no Palpitations: on  Carbonated beverages: no N/V/D/C/GAS: once a week bowel movement  Abdominal Pain: no Dumping syndrome: no  Recent physical activity:  App on phone avoiding pressure on left knee, 30 minutes 2-3 days a week   Progress Towards Goal(s):  In progress.  Handouts given during visit include:  Non-starchy vegetables    Nutritional Diagnosis:  [place]-3.3 Overweight/obesity related to past poor dietary habits and physical inactivity as evidenced by patient w/ recent sleeve surgery following dietary guidelines for continued weight loss.    Intervention:  Nutrition counseling. Dietitian educated th ept on advancing his diet to include starchy vegetables + protein. Goals: -Eat vegetables at least once 7 days a week -It is okay to add fruit as long as it does not interfere with eating your protein and vegetables  -Aim to increase your plain water consumption  Teaching Method Utilized:  Visual Auditory Hands on  Barriers to learning/adherence to lifestyle change: none identified   Demonstrated degree of understanding via:  Teach Back   Monitoring/Evaluation:  Dietary intake, exercise, and body weight.

## 2018-02-06 ENCOUNTER — Ambulatory Visit: Payer: Self-pay | Admitting: Skilled Nursing Facility1

## 2018-05-27 ENCOUNTER — Other Ambulatory Visit: Payer: Self-pay | Admitting: Cardiology

## 2018-05-27 DIAGNOSIS — Z9889 Other specified postprocedural states: Secondary | ICD-10-CM

## 2018-05-27 DIAGNOSIS — I10 Essential (primary) hypertension: Secondary | ICD-10-CM

## 2018-06-20 ENCOUNTER — Other Ambulatory Visit: Payer: Self-pay

## 2018-06-21 ENCOUNTER — Other Ambulatory Visit: Payer: Self-pay

## 2018-06-21 MED ORDER — AMLODIPINE BESYLATE 5 MG PO TABS: 5.0000 mg | ORAL_TABLET | Freq: Every day | ORAL | 3 refills | Status: DC

## 2018-06-23 ENCOUNTER — Encounter: Payer: Managed Care, Other (non HMO) | Attending: General Surgery | Admitting: Dietician

## 2018-06-23 ENCOUNTER — Encounter: Payer: Self-pay | Admitting: Dietician

## 2018-06-23 DIAGNOSIS — E669 Obesity, unspecified: Secondary | ICD-10-CM | POA: Insufficient documentation

## 2018-06-23 NOTE — Progress Notes (Signed)
Bariatric Follow-Up Visit Medical Nutrition Therapy  Appt Start Time: 9:40am End Time: 10:15am  1 Year + 4 Months Post-Operative Sleeve Gastrectomy Surgery Surgery Date: 02/07/2017  Overview of original goals: to rid of sleep apnea, come down/ of medications, achieve weight of 275 lbs     NUTRITION ASSESSMENT  Anthropometrics  Start weight at [facility]: 471 lbs (07/12/2016) Today's weight: 347.8lbs Total weight lost: -123.2 lbs  TANITA  Body Composition Results  02/24/2017 04/04/2017 06/13/2017 09/05/2017 06/23/2018   BMI (kg/m2) 47.1 44.8 41.5 39.3 37.3   Fat Mass (lbs) 217.4 161.4 139 130 118.6   Fat Free Mass (lbs) 222.6 256.8 248 236.4 229.2   Total Body Water (lbs) - 184.4 174 165 158   Total Body Water (%) - - - - 45.5   Clinical Medical Hx: obesity, sleep apnea, osteoarthritis, marfan syndrome, gastric reflux, hyperlipidemia, HTN, depression, anxiety Medications: see list  24-Hr Dietary Recall First Meal: protein shake (or [place] sandwich)  Snack: usually skips (or eggs + bacon)   Second Meal: chicken & rice soup (or steak fajitas + salad) Snack: usually skips (or other half of lunch)   Third Meal: spaghetti (soybean noodles) + meat sauce (or chicken wings)  Snack: popcorn (or Cheez-Its, or trail-mix)  Beverages: water w/ flavor packet + diet unsweetened tea + calorie free drinks  Food & Nutrition Related Hx Dietary Hx: Pt states he does not drink soda. Pt states the size of his meals/snacks has increased since he first had surgery, but are still smaller than the amounts he was eating prior to surgery. Pt states he tries to consume less than 2,100 to 2,500 calories/day. Pt states he snacks after dinner often, and thinks it is because his dinner meals are very small. Sometimes he will split his dinner meal into 2 smaller meals, consuming the second half as his snack later before bed. Pt states he occasionally drinks protein shakes if he feels as though he is not meeting his protein  goal that day.  Supplements: Flintstone's complete w/ iron (for past 2 months), Tums (2-3 x/day) (Pt NOT taking Bariatric MVI) Estimated Daily Fluid Intake: 80-90 oz Estimated Daily Protein Intake: ~80 g  Physical Activity  Current average weekly physical activity: some, but limited/not as much as pt would like (in physical therapy 1x/week; also gym 2-4x/week mix of cardio and strength)   Post-Op Goals Using straws: rarely  Drinking while eating: rarely  Chewing/swallowing difficulties: no Changes in vision: no Changes to mood/headaches: no Hair loss/changes to skin/nails: yes (pt educated on the importance of taking bariatric MVI and calcium, as well as consuming adequate protein, to help prevent these)  Difficulty focusing/concentrating: no Sweating: no Dizziness/lightheadedness: no Palpitations: no  Carbonated/caffeinated beverages: no N/V/D/C/Gas: no Abdominal pain: no Dumping syndrome: occasionally  Last Lap-Band fill: N/A   NUTRITION DIAGNOSIS  Overweight/obesity ([place]-3.3) related to past poor dietary habits and physical inactivity as evidenced by patient w/ completed Sleeve Gastrectomy surgery following dietary guidelines for continued weight loss.   NUTRITION INTERVENTION Nutrition counseling (C-1) and education (E-2) to facilitate bariatric surgery goals, including: . Diet advancement to the next phase (phase VII) now including all foods (lifelong maintenance)  . The importance of consuming adequate calories as well as certain nutrients daily due to the body's need for essential vitamins, minerals, and fats o Stressed importance of Bariatric-Specific MVI, Calcium, and adequate Protein to ensure proper nutrition needs are being met . The importance of daily physical activity and to reach a goal of at  least 150 minutes of moderate to vigorous physical activity weekly (or as directed by their physician) due to benefits such as increased musculature and improved lab  values  Handouts Provided Include   Phase VII: Lifelong Maintenance  MyPlate  Bariatric Meal Ideas   Bariatric Snack Ideas  Learning Style & Readiness for Change Teaching method utilized: Visual & Auditory  Demonstrated degree of understanding via: Teach Back  Barriers to learning/adherence to lifestyle change: Falling in and out of Contemplative Stage of Change (A few examples: Pt states he knows he needs to make better food choices, still chooses not to at times. Pt states he knows he should take his bariatric MVI and calcium supplements, but has decided to "take a break" for a while and only take about 2 calcium/day rather than 3.)  Goals Set by Patient  To rid of sleep apnea  To decrease medications (dosages and/or amount)   To get weight below 300 lbs  RD's Notes for Next Visit . Ensure pt consistently takes bariatric MVI and calcium    MONITORING & EVALUATION Dietary intake, weekly physical activity, body weight, and goals in 1 year.  Patient asked about "next level" care at this point post-operatively, such as weight-loss medications and other healthcare professionals (pt asked about a bariatrician) who can help him sustain his progress thus far and ensure he continues to progress towards his goals. RD encouraged continued follow ups at [facility] as well as routine follow ups with his surgeon to ensure his needs are being met.   Next Steps Patient is to follow-up in 12 months for follow-up.

## 2018-06-23 NOTE — Patient Instructions (Signed)
   Get back to taking your bariatric multivitamin every day, as well as calcium 3 times per day.   Use the Meal Ideas and Snack Ideas sheet to incorporate balanced meals/snacks into every day.   Continue aiming for at least 80 to 85 grams of protein every day, as well as a minimum of 64 ounces of fluid.   Keep up the great work and motivation! You have come a long way, focus on your goals and believe you can reach them.

## 2018-06-30 ENCOUNTER — Ambulatory Visit: Payer: Managed Care, Other (non HMO)

## 2018-06-30 DIAGNOSIS — Z9889 Other specified postprocedural states: Secondary | ICD-10-CM

## 2018-06-30 DIAGNOSIS — I10 Essential (primary) hypertension: Secondary | ICD-10-CM

## 2018-07-09 ENCOUNTER — Encounter: Payer: Self-pay | Admitting: Cardiology

## 2018-07-09 DIAGNOSIS — I10 Essential (primary) hypertension: Secondary | ICD-10-CM

## 2018-07-09 HISTORY — DX: Essential (primary) hypertension: I10

## 2018-07-09 NOTE — Progress Notes (Signed)
Subjective:  Primary Physician:  Margarito Courser, MD  Patient ID: Robert Stevenson, male    DOB: Aug 01, 1975, 43 y.o.   MRN: [id number]  Chief Complaint  Patient presents with  . Hypertension    aortic root repair   . Follow-up    HPI: Robert Stevenson  is a 43 y.o. male  with  Marfan syndrome, history of aortic root replacement at [facility] in 2012, history of bilateral retinal detachment, sleeve gastrectomy on 02/07/2017, he has lost 100 pounds total and 20 lbs since last office visit in January 2019.   Does feel as though he may be starting to plateau although he has lost additional 20-30 pounds since last office visit, totally has lost about 130 pounds.    He feels well and denies any chest pain, shortness of breath.   Past Medical History:  Diagnosis Date  . Anxiety   . Complication of anesthesia   . Depression   . Essential hypertension 07/09/2018  . GERD (gastroesophageal reflux disease)   . Heart murmur   . Hyperlipidemia   . Hypertension   . Marfan syndrome   . Marfan syndrome   . Obesity   . OSA (obstructive sleep apnea)   . PONV (postoperative nausea and vomiting)   . Sleep apnea    cpap- does not know settings   . Tuberculosis    hx fo TB and treated in 2016     Past Surgical History:  Procedure Laterality Date  . bilateral foot surgery     . CARDIAC VALVE REPLACEMENT     aortic root replacement and valve repair  . EYE SURGERY    . LAPAROSCOPIC GASTRIC SLEEVE RESECTION N/A 02/07/2017   Procedure: LAPAROSCOPIC GASTRIC SLEEVE RESECTION WITH UPPER ENDO;  Surgeon: Kinsinger, Arta Bruce, MD;  Location: [facility];  Service: General;  Laterality: N/A;    Social History   Socioeconomic History  . Marital status: Single    Spouse name: Not on file  . Number of children: Not on file  . Years of education: Not on file  . Highest education level: Not on file  Occupational History  . Not on file  Social Needs  . Financial resource strain: Not  on file  . Food insecurity:    Worry: Not on file    Inability: Not on file  . Transportation needs:    Medical: Not on file    Non-medical: Not on file  Tobacco Use  . Smoking status: Never Smoker  . Smokeless tobacco: Never Used  Substance and Sexual Activity  . Alcohol use: Yes    Comment: rare  . Drug use: No  . Sexual activity: Not on file  Lifestyle  . Physical activity:    Days per week: Not on file    Minutes per session: Not on file  . Stress: Not on file  Relationships  . Social connections:    Talks on phone: Not on file    Gets together: Not on file    Attends religious service: Not on file    Active member of club or organization: Not on file    Attends meetings of clubs or organizations: Not on file    Relationship status: Not on file  . Intimate partner violence:    Fear of current or ex partner: Not on file    Emotionally abused: Not on file    Physically abused: Not on file    Forced sexual activity: Not  on file  Other Topics Concern  . Not on file  Social History Narrative   Separated. Lives alone.   Social work x 15 yrs.    Current Outpatient Medications on File Prior to Visit  Medication Sig Dispense Refill  . amLODipine (NORVASC) 5 MG tablet Take 1 tablet (5 mg total) by mouth daily. 180 tablet 3  . atorvastatin (LIPITOR) 20 MG tablet Take 20 mg by mouth every morning.     . escitalopram (LEXAPRO) 10 MG tablet Take 10 mg by mouth daily.     Marland Kitchen losartan (COZAAR) 100 MG tablet Take 100 mg by mouth daily.    . metoprolol succinate (TOPROL-XL) 25 MG 24 hr tablet Take 25 mg by mouth daily.     . Multiple Vitamin (MULTIVITAMIN) tablet Take 1 tablet by mouth daily.     No current facility-administered medications on file prior to visit.     Review of Systems  Constitutional: Negative for malaise/fatigue and weight loss.  Respiratory: Negative for cough, hemoptysis and shortness of breath.   Cardiovascular: Positive for leg swelling (minimal left  leg). Negative for chest pain, palpitations and claudication.  Gastrointestinal: Negative for abdominal pain, blood in stool, constipation, heartburn and vomiting.  Genitourinary: Negative for dysuria.  Musculoskeletal: Positive for joint pain (knee pain improving). Negative for myalgias.  Neurological: Negative for dizziness, focal weakness and headaches.  Endo/Heme/Allergies: Does not bruise/bleed easily.  Psychiatric/Behavioral: Negative for depression. The patient is nervous/anxious.   All other systems reviewed and are negative.      Objective:  Blood pressure 139/81, pulse 64, height '6\' 9"'  (2.057 m), weight (!) 354 lb (160.6 kg), SpO2 98 %. Body mass index is 37.93 kg/m.  Physical Exam  Constitutional: He appears well-developed. No distress.  Marfanoid features. Moderately obese  HENT:  Head: Atraumatic.  Eyes: Conjunctivae are normal.  Neck: Neck supple. No JVD present. No thyromegaly present.  Cardiovascular: Normal rate, regular rhythm, intact distal pulses and normal pulses. Exam reveals no gallop.  Murmur heard.  Blowing early systolic murmur is present with a grade of 2/6 at the upper right sternal border. Varicose veins bilateral lower extremity noted Pulses:      Carotid pulses are on the left side with bruit. Prominent abdominal aortic pulsation noted  Pulmonary/Chest: Effort normal and breath sounds normal.  Abdominal: Soft. Bowel sounds are normal.  Musculoskeletal: Normal range of motion.        General: Edema (Trace left leg edema) present.  Neurological: He is alert.  Skin: Skin is warm and dry.  Psychiatric: He has a normal mood and affect.    CARDIAC STUDIES:    Echocardiogram 12/21/2017:  Left ventricle cavity is normal in size. Moderate concentric hypertrophy of the left ventricle. Normal global wall motion. Normal diastolic filling pattern. Calculated EF 66%.  Moderate (Grade II) aortic regurgitation. Increased aortic valve velocity likely due to  increased flow from aortic regurgitation.  Moderate (Grade II) mitral regurgitation.  Inadequate tricuspid regurgitation jet to estimate pulmonary artery pressure IVC is dilated with respiratory variation. Estimated RA pressure 8 mmHg.  S/p aortic root repair. Measurement 4.0 cm in AP dimension. Compared to previous study in 2018, there is minimal increase in aortic root size. Recommend repeat echocardiogram in 6 months.  Assessment & Recommendations:   1. Essential hypertension EKG 07/10/2018: Normal sinus rhythm at rate of 65 bpm, borderline criteria for left atrial enlargement, otherwise normal EKG.  2. H/O aortic root repair aortic root replacement at [facility] in 2012, follows by annual cardiac MR at [facility] by Dr. Mali Hughes.  CT abdomen 09/30/2006: No evidence of abdominal aortic aneurysm.  Normal bilateral iliac artery.  3. Marfan's syndrome with aortic dilation  4. History of sleeve gastrectomy 02/19/2017  5. 5. OSA on CPAP follows Dr. Dionne Milo in [place].  6. Left carotid bruit  6. Laboratory exam: 06/30/2018: CBC normal, potassium 4.7, BUN 14, creatinine 0.8, LFTs normal, eGFR greater than 60 mL.  Total cholesterol 105, triglycerides 43, HDL 48, LDL 49. Recommendation:   Gastric lap band surgery, he is very motivated in losing more weight, has been successful with weight loss of 130 pounds.  Blood pressure is well controlled.  I would like him to continue all 3 medications for now as would like to keep the blood pressure systolic less than 389 mmHg.  Lipids are very well controlled.  I reviewed his labs from his PCP.  He is also following very closely with [facility] with regard to aortic root repair.  His echocardiogram performed recently reveals stable aortic root, I do not think he needs a repeat echocardiogram in 6 months but will consider doing this in a year.  No changes in the medications were done by me today.  He does  have a faint left carotid bruit, we'll obtain carotid artery duplex.  I'll see him back on an annual basis.  No test were ordered apart from carotid duplex today.  Adrian Prows, MD, [facility] 07/10/2018, 8:46 PM [facility] [place] Pager: [phone] Office: [phone] If no answer Cell [phone]

## 2018-07-10 ENCOUNTER — Ambulatory Visit: Payer: Managed Care, Other (non HMO) | Admitting: Cardiology

## 2018-07-10 ENCOUNTER — Encounter: Payer: Self-pay | Admitting: Cardiology

## 2018-07-10 ENCOUNTER — Ambulatory Visit: Payer: Self-pay | Admitting: Cardiology

## 2018-07-10 ENCOUNTER — Ambulatory Visit: Payer: Self-pay | Admitting: Skilled Nursing Facility1

## 2018-07-10 VITALS — BP 139/81 | HR 64 | Ht >= 80 in | Wt 354.0 lb

## 2018-07-10 DIAGNOSIS — Z903 Acquired absence of stomach [part of]: Secondary | ICD-10-CM | POA: Diagnosis not present

## 2018-07-10 DIAGNOSIS — I10 Essential (primary) hypertension: Secondary | ICD-10-CM

## 2018-07-10 DIAGNOSIS — Z9889 Other specified postprocedural states: Secondary | ICD-10-CM

## 2018-07-10 DIAGNOSIS — R0989 Other specified symptoms and signs involving the circulatory and respiratory systems: Secondary | ICD-10-CM

## 2018-07-10 DIAGNOSIS — Q87410 Marfan's syndrome with aortic dilation: Secondary | ICD-10-CM | POA: Diagnosis not present

## 2018-07-10 DIAGNOSIS — Z9989 Dependence on other enabling machines and devices: Secondary | ICD-10-CM

## 2018-07-10 DIAGNOSIS — G4733 Obstructive sleep apnea (adult) (pediatric): Secondary | ICD-10-CM

## 2018-08-10 IMAGING — MR MR KNEE*L* W/O CM
4 of 6 series · 18 of 40 positions shown · non-contrast
Comparison: 09/18/2013 radiographs

CLINICAL DATA: Left knee pain for 6 months posteromedially.

EXAM:
MRI OF THE LEFT KNEE WITHOUT CONTRAST
TECHNIQUE: Multiplanar, multisequence MR imaging of the knee was performed. No
intravenous contrast was administered.

[Series 7: PD fat-sat · coronal · 3.0mm · 0.31mm/px · 7 of 38 slices shown (1 of 4)]
[im 1/38]
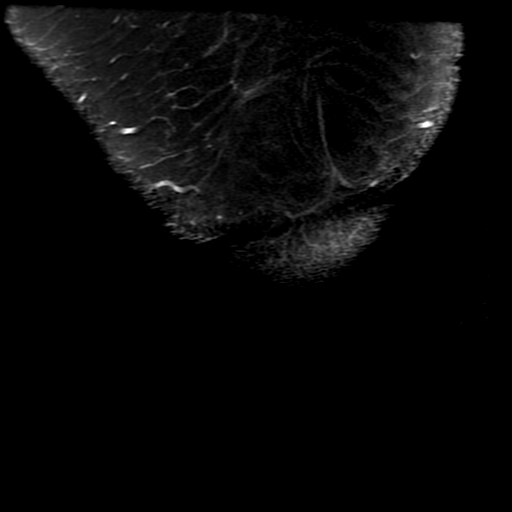
[im 7/38]
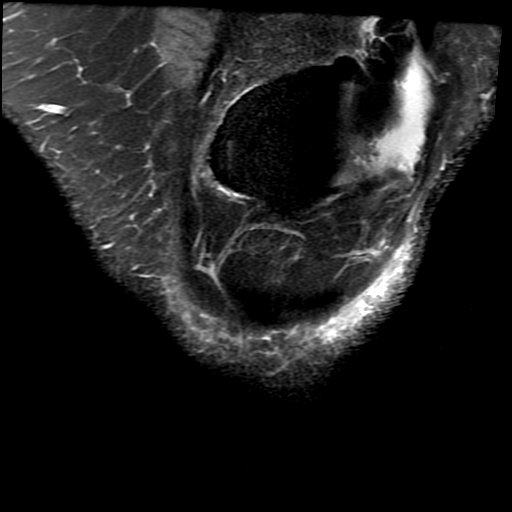
[im 13/38]
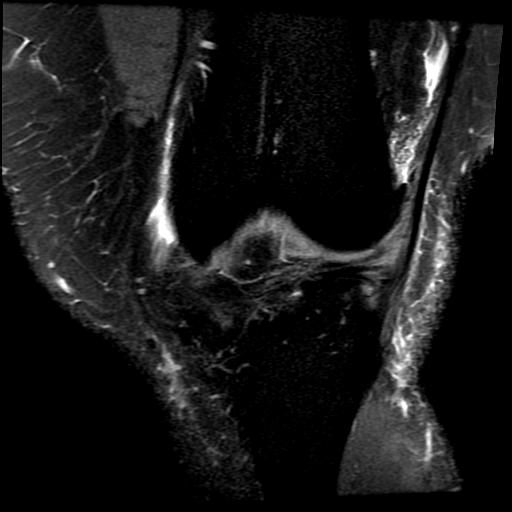
[im 19/38]
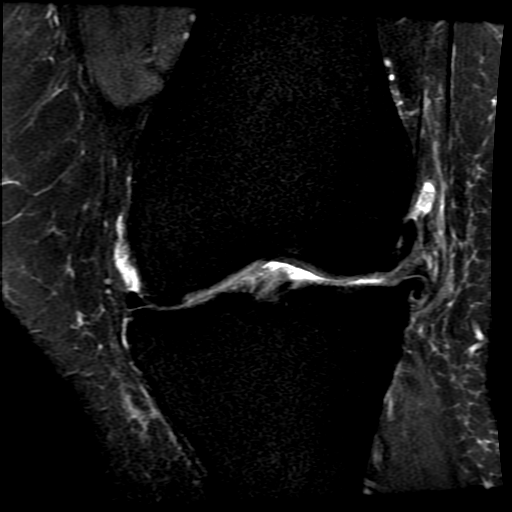
[im 25/38]
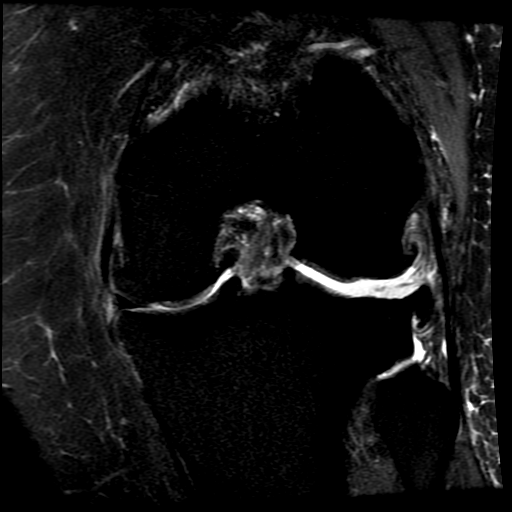
[im 31/38]
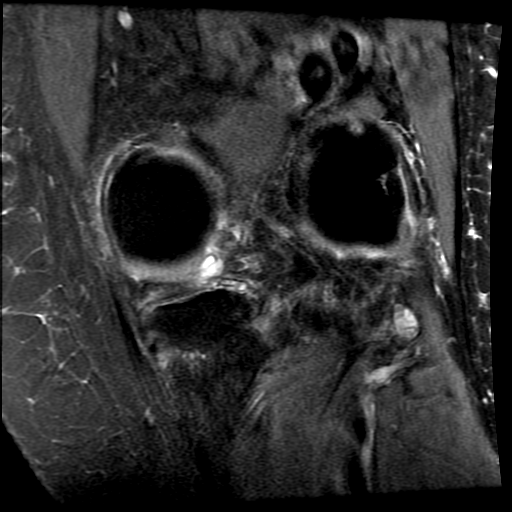
[im 38/38]
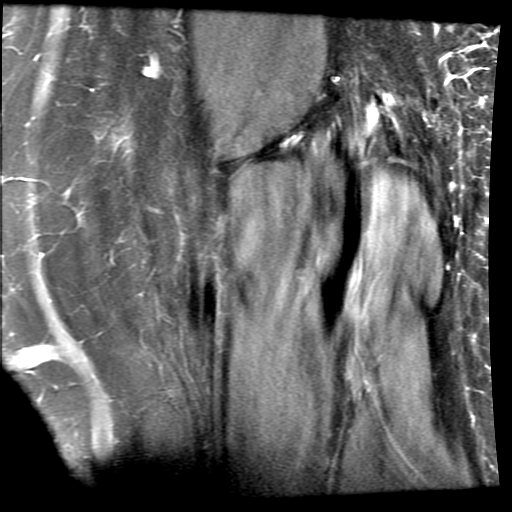

[Series 8: PD fat-sat · sagittal · 3.0mm · 0.29mm/px · 5 of 33 slices shown (2 of 4)]
[im 1/33]
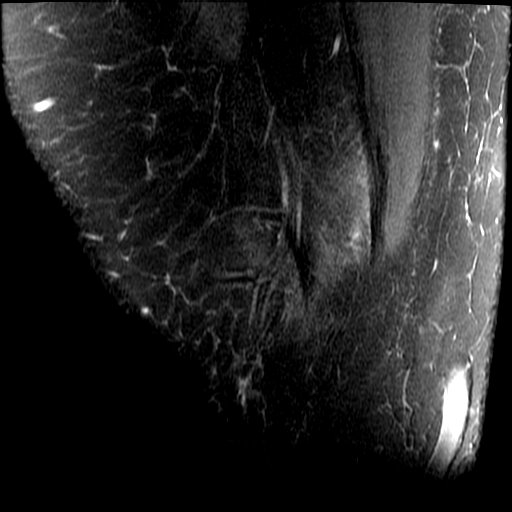
[im 6/33]
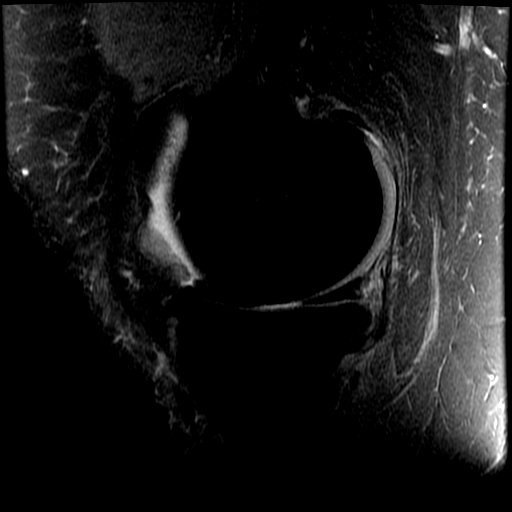
[im 11/33]
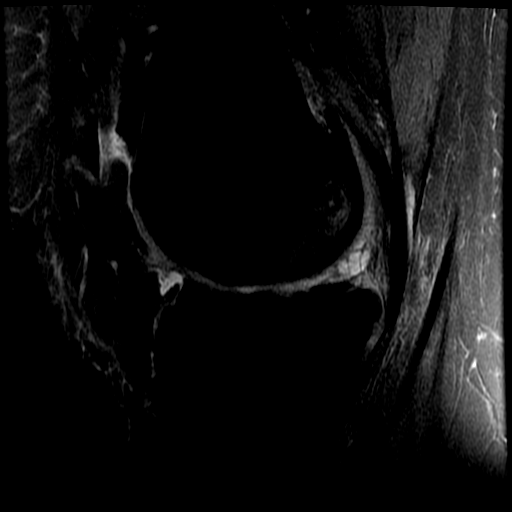
[im 17/33]
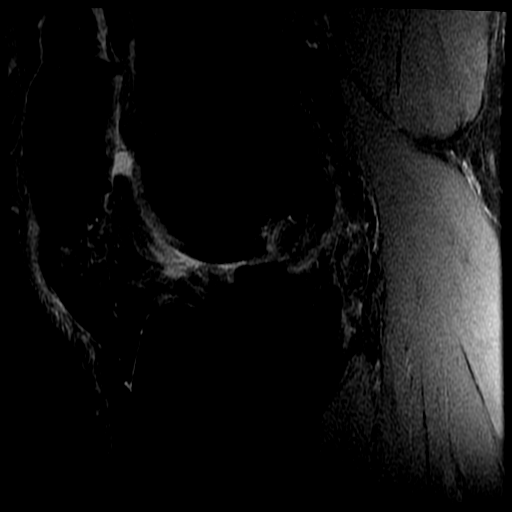
[im 27/33]
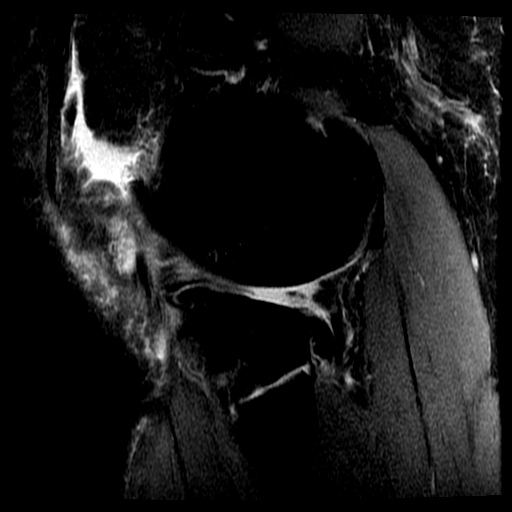

[Series 9: PD fat-sat · axial · 3.0mm · 0.31mm/px · z∈[-99,-8]mm · 3 of 38 slices shown (3 of 4)]
[im 6/38]
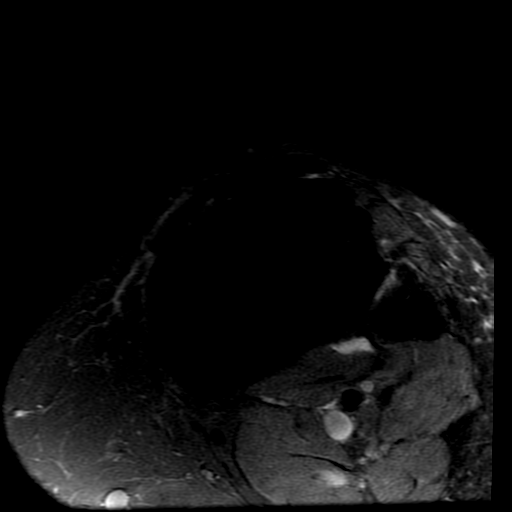
[im 22/38]
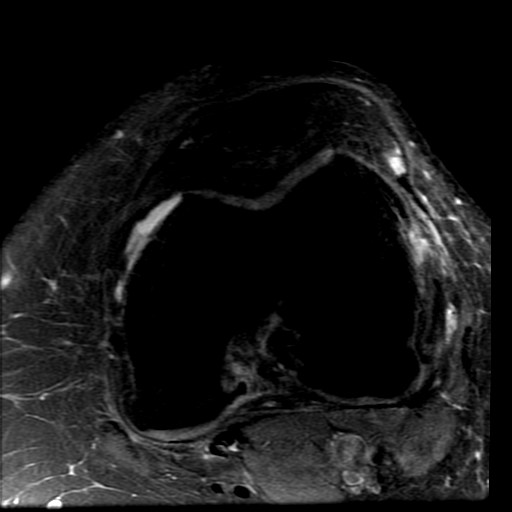
[im 32/38]
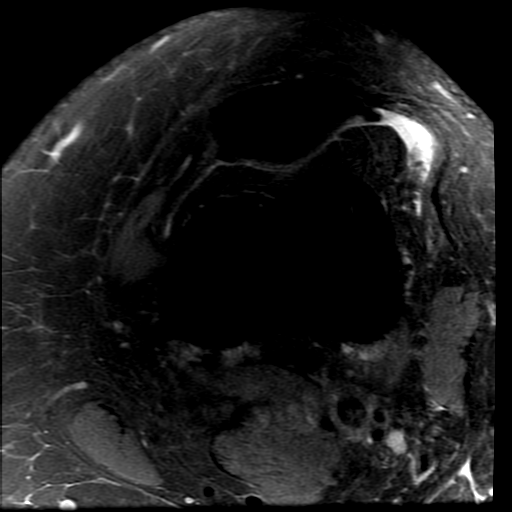

[Series 11: PD fat-sat · coronal · 2.0mm · 0.35mm/px · 3 of 15 slices shown (4 of 4)]
[im 1/15]
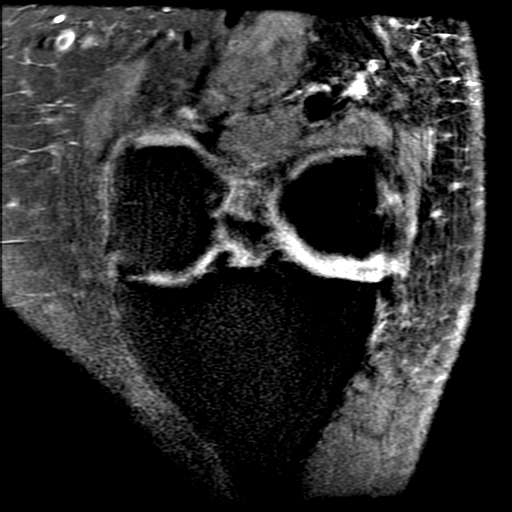
[im 8/15]
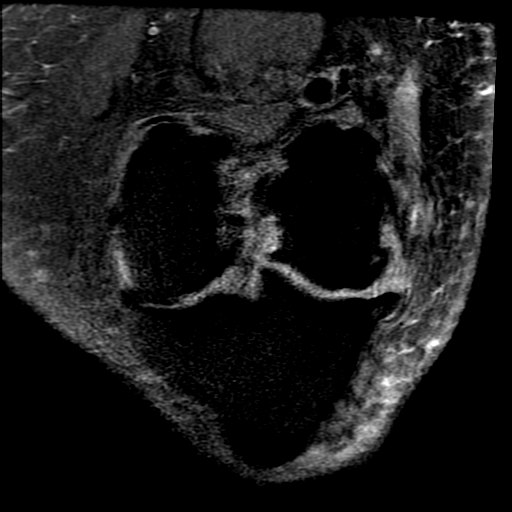
[im 15/15]
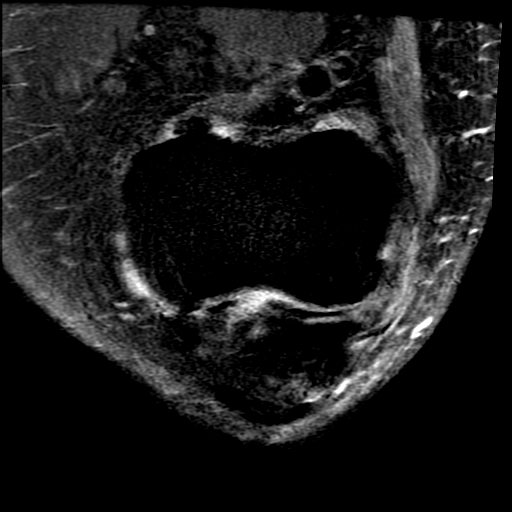

[18 of 40 positions shown; findings below may reference images not displayed]

FINDINGS: Despite efforts by the technologist and patient, motion artifact is
present on today's exam and could not be eliminated. This reduces
exam sensitivity and specificity.

MENISCI

Medial meniscus: Diminutive and somewhat peripherally extruded
midbody in the anterior horn medial meniscus without overt tear
identified.

Lateral meniscus: Severe complex tearing of the posterior horn and
midbody, with poor definition of the lateral meniscus in this region
of severe tearing, and severe diminution of residual meniscal
tissue.

LIGAMENTS

Cruciates:  Unremarkable

Collaterals:  Unremarkable

CARTILAGE

Patellofemoral: Mild chondral thinning especially along the
posterior patellar ridge with considerable marginal spurring.

Medial: Moderate to prominent chondral thinning with sagittally
oriented chondral for row centrally along the medial femoral
condyle. Severe marginal spurring causing meniscal extrusion.

Lateral: Prominent chondral thinning laterally in the lateral
compartment with prominent marginal spurring.

Joint:  Small knee effusion.  Mild synovitis.

Popliteal Fossa:  Unremarkable

Extensor Mechanism: Mildly lax lateral patellar retinaculum.
Hyperextended knee during imaging.

Bones: No significant extra-articular osseous abnormalities
identified.

Other: No supplemental non-categorized findings.
IMPRESSION: 1. Considerably advanced osteoarthritis for age, especially in the
lateral compartment where there is severe spurring and loss of
articular cartilage.
2. Severe complex tearing of the posterior horn and midbody lateral
meniscus, with poor definition of lateral meniscal tissue in this
vicinity.
3. Diminutive and somewhat peripherally extruded medial meniscus
without a definite medial meniscal tear.
4. Small knee effusion with mild synovitis.
5. Hyperextension of the knee likely causes the laxity visible in
the extensor mechanism.

## 2018-08-28 ENCOUNTER — Other Ambulatory Visit: Payer: Managed Care, Other (non HMO)

## 2018-10-04 ENCOUNTER — Other Ambulatory Visit: Payer: Managed Care, Other (non HMO)

## 2018-10-04 DIAGNOSIS — Z5329 Procedure and treatment not carried out because of patient's decision for other reasons: Secondary | ICD-10-CM

## 2019-04-12 DIAGNOSIS — Z9889 Other specified postprocedural states: Secondary | ICD-10-CM | POA: Insufficient documentation

## 2019-04-12 DIAGNOSIS — Z8679 Personal history of other diseases of the circulatory system: Secondary | ICD-10-CM | POA: Insufficient documentation

## 2019-05-24 ENCOUNTER — Ambulatory Visit (INDEPENDENT_AMBULATORY_CARE_PROVIDER_SITE_OTHER): Payer: Managed Care, Other (non HMO)

## 2019-05-24 ENCOUNTER — Other Ambulatory Visit: Payer: Self-pay

## 2019-05-24 DIAGNOSIS — R0989 Other specified symptoms and signs involving the circulatory and respiratory systems: Secondary | ICD-10-CM

## 2019-05-29 NOTE — Progress Notes (Signed)
No carotid stenosis, normal study

## 2019-06-19 DIAGNOSIS — R7303 Prediabetes: Secondary | ICD-10-CM | POA: Insufficient documentation

## 2019-06-26 ENCOUNTER — Ambulatory Visit: Payer: Self-pay | Admitting: Dietician

## 2019-07-08 NOTE — Progress Notes (Signed)
Primary Physician/Referring:  Margarito Courser, MD  Patient ID: Robert Stevenson, male    DOB: [birth year], 44 y.o.   MRN: [id number]  Chief Complaint  Patient presents with  . Aortic Root Repair    1 year follow up  . Hypertension  . Hyperlipidemia   HPI:    Robert Stevenson  is a 44 y.o. AAM patient with  Marfan syndrome, history of aortic root replacement at [facility] in 2012, history of bilateral retinal detachment, sleeve gastrectomy on 02/07/2017. He presents for annual f/u.   He is presently doing well, although he had lost about 150 pounds, is gained about 50 pounds back.  He is trying to be careful with his weight gain.  He has no specific complaints today, states that he is tolerating all his medications and tries to be physically active.  Past Medical History:  Diagnosis Date  . Anxiety   . Complication of anesthesia   . Depression   . Essential hypertension 07/09/2018  . GERD (gastroesophageal reflux disease)   . Heart murmur   . Hyperlipidemia   . Hypertension   . Marfan syndrome   . Marfan syndrome   . Obesity   . OSA (obstructive sleep apnea)   . PONV (postoperative nausea and vomiting)   . Sleep apnea    cpap- does not know settings   . Tuberculosis    hx fo TB and treated in 2016    Past Surgical History:  Procedure Laterality Date  . bilateral foot surgery     . CARDIAC VALVE REPLACEMENT     aortic root replacement and valve repair  . EYE SURGERY    . LAPAROSCOPIC GASTRIC SLEEVE RESECTION N/A 02/07/2017   Procedure: LAPAROSCOPIC GASTRIC SLEEVE RESECTION WITH UPPER ENDO;  Surgeon: Kinsinger, Arta Bruce, MD;  Location: [facility];  Service: General;  Laterality: N/A;   Family History  Problem Relation Age of Onset  . Diabetes Mother   . Stroke Mother   . Hypertension Father   . Diabetes Father   . Heart attack Father     Social History   Tobacco Use  . Smoking status: Never Smoker  . Smokeless tobacco: Never Used  Substance Use  Topics  . Alcohol use: Not Currently    Comment: rare   ROS  Review of Systems  Cardiovascular: Negative for chest pain, dyspnea on exertion and leg swelling.  Gastrointestinal: Negative for melena.   Objective  Blood pressure 135/80, pulse 65, temperature (!) 96.8 F (36 C), temperature source Oral, resp. rate 12, height '6\' 9"'  (2.057 m), weight (!) 377 lb (171 kg), SpO2 99 %.  Vitals with BMI 07/09/2019 07/10/2018 01/05/2018  Height '6\' 9"'  '6\' 9"'  '6\' 9"'   Weight 377 lbs 354 lbs 347 lbs 6 oz  BMI 40.42 16.38 46.65  Systolic 993 570 177  Diastolic 80 81 75  Pulse 65 64 62     Physical Exam  Constitutional:  Marfanoid features. Moderately obese  Cardiovascular: Normal rate, regular rhythm, intact distal pulses and normal pulses. Exam reveals no gallop.  Murmur heard.  Blowing early systolic murmur is present with a grade of 2/6 at the upper right sternal border. Varicose veins bilateral lower extremity noted Pulses:      Carotid pulses are on the left side with bruit. Prominent abdominal aortic pulsation noted. No JVD. Trace bilateral leg edema.   Pulmonary/Chest: Effort normal and breath sounds normal.  Abdominal: Soft. Bowel sounds are normal.   Laboratory  examination:   External labs:   Care Everywhere Result Report CBC, Platelet; No DifferentialResulted: 06/27/2019 2:35 AM [facility] Component Name Value Ref Range WBC 4.3 3.4 - 10.8 x10E3/uL RBC 5.49 4.14 - 5.8 x10E6/uL Hemoglobin 15.0 13 - 17.7 g/dL Hematocrit 45.9 37.5 - 51 % MCV 84 79 - 97 fL MCH 27.3 26.6 - 33 pg MCHC 32.7 31.5 - 35.7 g/dL RDW 13.3 11.6 - 15.4 % Platelet Count 232 150 - 450 x10E3/uL  Comprehensive metabolic panelResulted: 9/47/0962 2:35 AM [facility] Component Name Value Ref Range Glucose 90 65 - 99 mg/dL BUN 17 6 - 24 mg/dL Creatinine 1.02 0.76 - 1.27 mg/dL eGFR If NonAfrican American 91 >59 mL/min/1.73 eGFR If African American 105 >59 mL/min/1.73 BUN/Creatinine Ratio 17 9 - 20   Sodium 140 134 - 144 mmol/L Potassium 4.8 3.5 - 5.2 mmol/L Chloride 106 96 - 106 mmol/L CO2 23 20 - 29 mmol/L CALCIUM 8.9 8.7 - 10.2 mg/dL Total Protein 6.7 6 - 8.5 g/dL Albumin, Serum 3.8 (L) 4 - 5 g/dL Globulin, Total 2.9 1.5 - 4.5 g/dL Albumin/Globulin Ratio 1.3 1.2 - 2.2  Total Bilirubin 0.7 0 - 1.2 mg/dL Alkaline Phosphatase 81 39 - 117 IU/L AST 22 0 - 40 IU/L ALT (SGPT) 15 0 - 44 IU/L  Lipid Panel With LDL/HDL RatioResulted: 06/27/2019 2:35 AM [facility] Component Name Value Ref Range Cholesterol, Total 120 100 - 199 mg/dL Triglycerides 57 0 - 149 mg/dL HDL 49 >39 mg/dL VLDL Cholesterol Cal 13 5 - 40 mg/dL LDL 58 0 - 99 mg/dL   Medications and allergies  No Known Allergies   Current Outpatient Medications  Medication Instructions  . amLODipine (NORVASC) 5 mg, Oral, Daily  . atorvastatin (LIPITOR) 20 mg, Oral, BH-each morning  . escitalopram (LEXAPRO) 20 mg, Oral, Daily  . losartan (COZAAR) 100 mg, Oral, Daily  . metoprolol succinate (TOPROL-XL) 25 mg, Oral, Daily  . Multiple Vitamin (MULTIVITAMIN) tablet 1 tablet, Oral, Daily   Radiology:   Cardiac and aortic MRI on 03/04/2019: Left ventricle is moderately enlarged, slightly worse than previous.  Moderate LVH.  Normal LVEF 61%. Right ventricle is in the upper limits of normal.  Normal RV systolic function. Moderately enlarged left atrium. Mild aortic regurgitation. Mild MR.  Aortic root and ascending aorta/graft is normal in size.  Normal thoracic and abdominal aorta. Proximal right common iliac artery is mildly dilated compared to left at 20 and 17 mm.  Cardiac Studies:   Echocardiogram 12/21/2017:  Left ventricle cavity is normal in size. Moderate concentric hypertrophy of the left ventricle. Normal global wall motion. Normal diastolic filling pattern. Calculated EF 66%.  Moderate (Grade II) aortic regurgitation. Increased aortic valve velocity likely due to increased flow from aortic regurgitation.   Moderate (Grade II) mitral regurgitation.  Inadequate tricuspid regurgitation jet to estimate pulmonary artery pressure IVC is dilated with respiratory variation. Estimated RA pressure 8 mmHg.  S/p aortic root repair. Measurement 4.0 cm in AP dimension. Compared to previous study in 2018, there is minimal increase in aortic root size. Recommend repeat echocardiogram in 6 months.  Carotid artery duplex 05/24/2019:  No hemodynamically significant arterial disease in the internal carotid  artery bilaterally. Minimal soft plaque bilateral carotid arteries.  Antegrade right vertebral artery flow. Antegrade left vertebral artery  flow.  EKG EKG 07/09/2019: Sinus rhythm with first-degree block at rate of 63 bpm, left atrial enlargement, left axis deviation.  Incomplete right bundle branch block.  No evidence of ischemia, normal QT interval.  EKG 07/10/2018: Normal sinus rhythm at rate of 65 bpm, borderline criteria for left atrial enlargement, otherwise normal EKG.  Assessment     ICD-10-CM   1. Marfan's syndrome with aortic dilation  Q87.410 EKG 12-Lead  2. H/O aortic root repair  Z98.890   3. Essential hypertension  I10   4. Class 3 severe obesity due to excess calories with serious comorbidity and body mass index (BMI) of 40.0 to 44.9 in adult (HCC)  E66.01    Z68.41     No orders of the defined types were placed in this encounter.   There are no discontinued medications.  Recommendations:   Robert Stevenson  is a 44 y.o. AAM patient with  Marfan syndrome, history of aortic root replacement at [facility] in 2012, history of bilateral retinal detachment, sleeve gastrectomy on 02/07/2017.  He is presently doing well, essentially remains asymptomatic.  He has gained the weight back, his BMI was 37 previously now back to 40.  We discussed  Weight loss.  I reviewed his labs, lipids are under good control, normal renal function and hepatic function.  I also reviewed his  cardiac MRI and aortic MRI from [facility], no change in overall dimensions in aorta, mild cardiomegaly noted.  There is no clinical evidence of congestive heart failure.  I did not make any changes to his medication, he is on appropriately on ARB, beta-blocker.  I will see him back in 1 year.  Adrian Prows, MD, [facility] 07/12/2019, 2:24 PM [facility]. [place] Office: [phone]

## 2019-07-09 ENCOUNTER — Other Ambulatory Visit: Payer: Self-pay

## 2019-07-09 ENCOUNTER — Encounter: Payer: Self-pay | Admitting: Cardiology

## 2019-07-09 ENCOUNTER — Ambulatory Visit: Payer: Managed Care, Other (non HMO) | Admitting: Cardiology

## 2019-07-09 VITALS — BP 135/80 | HR 65 | Temp 96.8°F | Resp 12 | Ht >= 80 in | Wt 377.0 lb

## 2019-07-09 DIAGNOSIS — Z9889 Other specified postprocedural states: Secondary | ICD-10-CM

## 2019-07-09 DIAGNOSIS — I10 Essential (primary) hypertension: Secondary | ICD-10-CM

## 2019-07-09 DIAGNOSIS — Q87410 Marfan's syndrome with aortic dilation: Secondary | ICD-10-CM

## 2019-08-03 ENCOUNTER — Other Ambulatory Visit: Payer: Self-pay | Admitting: Cardiology

## 2019-08-22 ENCOUNTER — Encounter (HOSPITAL_COMMUNITY): Payer: Self-pay

## 2020-01-11 ENCOUNTER — Emergency Department: Payer: Managed Care, Other (non HMO)

## 2020-01-11 ENCOUNTER — Other Ambulatory Visit: Payer: Self-pay

## 2020-01-11 ENCOUNTER — Emergency Department (INDEPENDENT_AMBULATORY_CARE_PROVIDER_SITE_OTHER): Payer: Managed Care, Other (non HMO)

## 2020-01-11 ENCOUNTER — Encounter: Payer: Self-pay | Admitting: Emergency Medicine

## 2020-01-11 ENCOUNTER — Emergency Department (INDEPENDENT_AMBULATORY_CARE_PROVIDER_SITE_OTHER)
Admission: EM | Admit: 2020-01-11 | Discharge: 2020-01-11 | Disposition: A | Discharge status: Home / Self Care | Payer: Managed Care, Other (non HMO) | Source: Home / Self Care

## 2020-01-11 DIAGNOSIS — S80812A Abrasion, left lower leg, initial encounter: Secondary | ICD-10-CM | POA: Diagnosis not present

## 2020-01-11 DIAGNOSIS — L089 Local infection of the skin and subcutaneous tissue, unspecified: Secondary | ICD-10-CM | POA: Diagnosis not present

## 2020-01-11 DIAGNOSIS — R600 Localized edema: Secondary | ICD-10-CM

## 2020-01-11 DIAGNOSIS — W271XXA Contact with garden tool, initial encounter: Secondary | ICD-10-CM | POA: Diagnosis not present

## 2020-01-11 MED ORDER — AMOXICILLIN-POT CLAVULANATE 875-125 MG PO TABS: 1.0000 | ORAL_TABLET | Freq: Two times a day (BID) | ORAL | 0 refills | Status: DC

## 2020-01-11 NOTE — ED Provider Notes (Signed)
[facility]    CSN: [id number] Arrival date & time: 01/11/20  0914      History   Chief Complaint Chief Complaint  Patient presents with  . Ankle Injury  . Joint Swelling    HPI Robert Stevenson is a 44 y.o. male.   HPI Robert Stevenson is a 44 y.o. male presenting to UC with c/o gradually worsening swelling of his Left lower leg, ankle, and foot after dropping a lawnmower on her Left lower leg about 8 days ago. Pain was mild and has continued to stay mild, aching, sore, but he is concerned about the amount of swelling. Pt wants to make sure he has not clot. No prior hx of blood clots.  He had the Pfizer COVID vaccine in January 2021 and had a breakthrough case of COVID in August 2021.  He reports hx of marfan syndrome, which has resulted in aortic valve and root repair and thoracic aortic aneurysm repair but denies hx of heart failure. Denies chest pain or SOB. No fever or chills.    Past Medical History:  Diagnosis Date  . Anxiety   . Complication of anesthesia   . Depression   . Essential hypertension 07/09/2018  . GERD (gastroesophageal reflux disease)   . Heart murmur   . Hyperlipidemia   . Hypertension   . Marfan syndrome   . Marfan syndrome   . Obesity   . OSA (obstructive sleep apnea)   . PONV (postoperative nausea and vomiting)   . Sleep apnea    cpap- does not know settings   . Tuberculosis    hx fo TB and treated in 2016     Patient Active Problem List   Diagnosis Date Noted  . Prediabetes 06/19/2019  . History of aortic valve repair 04/12/2019  . History of thoracic aortic aneurysm repair 04/12/2019  . Essential hypertension 07/09/2018  . Microcytosis 06/28/2017  . Osteoarthritis of multiple joints 06/27/2017  . Morbid obesity (HCC) 02/07/2017  . Fatigue 01/31/2017  . Normocytic anemia 01/31/2017  . Ascending aortic aneurysm (HCC) 07/26/2016  . Gastric reflux 07/26/2016  . 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 Anxiety disorder 08/04/2015  . History of aortic root repair 08/04/2015  . 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 OSA (obstructive sleep apnea) 03/13/2015  . Severe obesity (HCC) 03/13/2015  . Low back pain 02/07/2014  . Marfan syndrome 09/18/2013  . Osteoarthritis of both knees 09/18/2013    Past Surgical History:  Procedure Laterality Date  . bilateral foot surgery     . CARDIAC VALVE REPLACEMENT     aortic root replacement and valve repair  . EYE SURGERY    . LAPAROSCOPIC GASTRIC SLEEVE RESECTION N/A 02/07/2017   Procedure: LAPAROSCOPIC GASTRIC SLEEVE RESECTION WITH UPPER ENDO;  Surgeon: Kinsinger, De BlanchLuke Aaron, MD;  Location: [facility];  Service: General;  Laterality: N/A;       Home Medications    Prior to Admission medications   Medication Sig Start Date End Date Taking? Authorizing Provider  amLODipine (NORVASC) 5 MG tablet Take 1 tablet by mouth once daily 08/06/19  Yes Yates DecampGanji, Jay, MD  atorvastatin (LIPITOR) 20 MG tablet Take 20 mg by mouth every morning.    Yes [provider]  buPROPion (WELLBUTRIN XL) 150 MG 24 hr tablet Take by mouth. 11/15/19  Yes [provider]  celecoxib (CELEBREX) 200 MG capsule Take by mouth. 07/12/19  Yes [provider]  losartan (COZAAR) 100 MG tablet Take 100 mg by mouth daily.   Yes [provider]  metoprolol succinate (TOPROL-XL) 25 MG 24 hr tablet Take 25 mg by mouth daily.    Yes [provider]  Multiple Vitamin (MULTIVITAMIN) tablet Take 1 tablet by mouth daily.   Yes [provider]  amoxicillin-clavulanate (AUGMENTIN) 875-125 MG tablet Take 1 tablet by mouth 2 (two) times daily. One po bid x 7 days 01/11/20   Lurene Shadow, PA-C  escitalopram (LEXAPRO) 10 MG tablet Take 20 mg by mouth daily.  09/08/15   [provider]    Family History Family History  Problem Relation Age of Onset  . Diabetes Mother   . Stroke Mother   .  Hypertension Father   . Diabetes Father   . Heart attack Father     Social History Social History   Tobacco Use  . Smoking status: Never Smoker  . Smokeless tobacco: Never Used  Vaping Use  . Vaping Use: Never used  Substance Use Topics  . Alcohol use: Not Currently    Comment: rare  . Drug use: No     Allergies   Patient has no known allergies.   Review of Systems Review of Systems  Constitutional: Negative for chills and fever.  Respiratory: Negative for chest tightness and shortness of breath.   Cardiovascular: Positive for leg swelling. Negative for chest pain and palpitations.  Musculoskeletal: Positive for arthralgias (minimal, pressure Left ankle) and joint swelling. Negative for gait problem.  Skin: Positive for wound. Negative for color change.     Physical Exam Triage Vital Signs ED Triage Vitals  Enc Vitals Group     BP 01/11/20 0934 132/82     Pulse Rate 01/11/20 0934 66     Resp 01/11/20 0934 16     Temp 01/11/20 0934 98.3 F (36.8 C)     Temp Source 01/11/20 0934 Oral     SpO2 01/11/20 0934 99 %     Weight 01/11/20 0937 (!) 375 lb (170.1 kg)     Height 01/11/20 0937 6\' 9"  (2.057 m)     Head Circumference --      Peak Flow --      Pain Score 01/11/20 0937 0     Pain Loc --      Pain Edu? --      Excl. in GC? --    No data found.  Updated Vital Signs BP 132/82 (BP Location: Right Arm)   Pulse 66   Temp 98.3 F (36.8 C) (Oral)   Resp 16   Ht 6\' 9"  (2.057 m)   Wt (!) 375 lb (170.1 kg)   SpO2 99%   BMI 40.19 kg/m   Visual Acuity Right Eye Distance:   Left Eye Distance:   Bilateral Distance:    Right Eye Near:   Left Eye Near:    Bilateral Near:     Physical Exam Vitals and nursing note reviewed.  Constitutional:      General: He is not in acute distress.    Appearance: Normal appearance. He is well-developed. He is not ill-appearing, toxic-appearing or diaphoretic.  HENT:     Head: Normocephalic and atraumatic.   Cardiovascular:     Rate and Rhythm: Normal rate.  Pulmonary:     Effort: Pulmonary effort is normal.  Musculoskeletal:        General: Swelling and tenderness present. Normal range of motion.     Cervical back:  Normal range of motion.     Comments: Left lower leg: moderate to significant edema in lower leg ankle and foot compared to Right lower leg. Mild tenderness, mainly around abrasion over anterior distal tibia. No induration or fluctuance. Full ROM knee, ankle and toes. Calf is non-tender.  Skin:    General: Skin is warm and dry.     Capillary Refill: Capillary refill takes less than 2 seconds.     Findings: Abrasion present. No erythema.       Neurological:     Mental Status: He is alert and oriented to person, place, and time.     Sensory: No sensory deficit.  Psychiatric:        Behavior: Behavior normal.      UC Treatments / Results  Labs (all labs ordered are listed, but only abnormal results are displayed) Labs Reviewed - No data to display  EKG   Radiology DG Ankle Complete Left  Result Date: 01/11/2020 CLINICAL DATA:  Lower extremity edema after dropping heavy object on lower extremity EXAM: LEFT ANKLE COMPLETE - 3+ VIEW COMPARISON:  None. FINDINGS: Frontal, oblique, and lateral views were obtained. There is generalized soft tissue swelling. No appreciable fracture or joint effusion. There is mild generalized joint space narrowing. No erosive changes. There is pes planus. Ankle mortise appears intact. Scattered phleboliths noted. IMPRESSION: Soft tissue swelling. No fracture or joint effusion. Mild joint space narrowing in the ankle joint. Pes planus. Ankle mortise appears intact. Electronically Signed   By: Bretta Bang III M.D.   On: 01/11/2020 10:56   US Venous Img Lower Unilateral Left  Result Date: 01/11/2020 CLINICAL DATA:  Lower extremity edema after trauma EXAM: LEFT LOWER EXTREMITY VENOUS DUPLEX ULTRASOUND TECHNIQUE: Gray-scale sonography with  graded compression, as well as color Doppler and duplex ultrasound were performed to evaluate the left lower extremity deep venous system from the level of the common femoral vein and including the common femoral, femoral, profunda femoral, popliteal and calf veins including the posterior tibial, peroneal and gastrocnemius veins when visible. The superficial great saphenous vein was also interrogated. Spectral Doppler was utilized to evaluate flow at rest and with distal augmentation maneuvers in the common femoral, femoral and popliteal veins. COMPARISON:  None. FINDINGS: Contralateral Common Femoral Vein: Respiratory phasicity is normal and symmetric with the symptomatic side. No evidence of thrombus. Normal compressibility. Common Femoral Vein: No evidence of thrombus. Normal compressibility, respiratory phasicity and response to augmentation. Saphenofemoral Junction: No evidence of thrombus. Normal compressibility and flow on color Doppler imaging. Profunda Femoral Vein: No evidence of thrombus. Normal compressibility and flow on color Doppler imaging. Femoral Vein: No evidence of thrombus. Normal compressibility, respiratory phasicity and response to augmentation. Popliteal Vein: No evidence of thrombus. Normal compressibility, respiratory phasicity and response to augmentation. Calf Veins: No evidence of thrombus. Normal compressibility and flow on color Doppler imaging. No limited visualization of calf veins due to soft tissue edema. Superficial Great Saphenous Vein: No evidence of thrombus. Normal compressibility. Venous Reflux:  None. Other Findings:  Left lower extremity soft tissue edema noted. IMPRESSION: No evidence of deep venous thrombosis in the left lower extremity. Significant limitation of visualization of calf veins due to soft tissue edema in the left lower extremity. Right common femoral vein also patent. Electronically Signed   By: Bretta Bang III M.D.   On: 01/11/2020 10:57     Procedures Procedures (including critical care time)  Medications Ordered in UC Medications - No data to display  Initial  Impression / Assessment and Plan / UC Course  I have reviewed the triage vital signs and the nursing notes.  Pertinent labs & imaging results that were available during my care of the patient were reviewed by me and considered in my medical decision making (see chart for details).     Discussed imaging with pt Reassured pt no fracture or DVT Due to wound on anterior leg and worsening swelling, will cover for secondary bacterial infection F/u with PCP as needed AVS given  Final Clinical Impressions(s) / UC Diagnoses   Final diagnoses:  Edema of left lower leg  Infected abrasion of lower leg, left, initial encounter     Discharge Instructions      Continue to elevate your legs to help with edema. Please take the prescribed antibiotic to cover for bacterial infection of the wound on your leg from the lawn mower. Please call to schedule a follow up appointment with your primary care provider next week for further evaluation and treatment of your symptoms.  Call 911 or go to the hospital if symptoms worsening- worsening swelling, pain, trouble breathing, fever, or other new concerning symptoms develop.     ED Prescriptions    Medication Sig Dispense Auth. Provider   amoxicillin-clavulanate (AUGMENTIN) 875-125 MG tablet Take 1 tablet by mouth 2 (two) times daily. One po bid x 7 days 14 tablet Lurene Shadow, [place]     PDMP not reviewed this encounter.   Lurene Shadow, [place] 01/13/20 0102

## 2020-01-11 NOTE — ED Triage Notes (Signed)
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

## 2020-01-11 NOTE — Discharge Instructions (Signed)
  Continue to elevate your legs to help with edema. Please take the prescribed antibiotic to cover for bacterial infection of the wound on your leg from the lawn mower. Please call to schedule a follow up appointment with your primary care provider next week for further evaluation and treatment of your symptoms.  Call 911 or go to the hospital if symptoms worsening- worsening swelling, pain, trouble breathing, fever, or other new concerning symptoms develop.

## 2020-07-07 ENCOUNTER — Encounter: Payer: Self-pay | Admitting: Student

## 2020-07-07 ENCOUNTER — Ambulatory Visit (INDEPENDENT_AMBULATORY_CARE_PROVIDER_SITE_OTHER): Payer: Managed Care, Other (non HMO) | Admitting: Student

## 2020-07-07 ENCOUNTER — Other Ambulatory Visit: Payer: Self-pay

## 2020-07-07 VITALS — BP 133/80 | HR 63 | Temp 97.5°F | Ht >= 80 in | Wt 379.0 lb

## 2020-07-07 DIAGNOSIS — Z9889 Other specified postprocedural states: Secondary | ICD-10-CM

## 2020-07-07 DIAGNOSIS — Q87410 Marfan's syndrome with aortic dilation: Secondary | ICD-10-CM

## 2020-07-07 DIAGNOSIS — G4733 Obstructive sleep apnea (adult) (pediatric): Secondary | ICD-10-CM

## 2020-07-07 DIAGNOSIS — I10 Essential (primary) hypertension: Secondary | ICD-10-CM

## 2020-07-07 DIAGNOSIS — I714 Abdominal aortic aneurysm, without rupture: Secondary | ICD-10-CM

## 2020-07-07 MED ORDER — AMLODIPINE BESYLATE 5 MG PO TABS: 5.0000 mg | ORAL_TABLET | Freq: Every day | ORAL | 3 refills | Status: DC

## 2020-07-07 MED ORDER — ATORVASTATIN CALCIUM 20 MG PO TABS: 20.0000 mg | ORAL_TABLET | ORAL | 3 refills | Status: DC

## 2020-07-07 MED ORDER — LOSARTAN POTASSIUM 100 MG PO TABS: 100.0000 mg | ORAL_TABLET | Freq: Every day | ORAL | 3 refills | Status: DC

## 2020-07-07 MED ORDER — METOPROLOL SUCCINATE ER 25 MG PO TB24: 25.0000 mg | ORAL_TABLET | Freq: Every day | ORAL | 3 refills | Status: DC

## 2020-07-07 NOTE — Progress Notes (Addendum)
Primary Physician/Referring:  Margarito Courser, MD  Patient ID: Robert Stevenson, male    DOB: [birth year], 45 y.o.   MRN: [id number]  Chief Complaint  Patient presents with   AAA   Follow-up   HPI:    Robert Stevenson  is a 45 y.o. AAM patient with  Marfan syndrome, history of aortic root replacement at [facility] in 2012, history of bilateral retinal detachment, sleeve gastrectomy on 02/07/2017.  Presents for annual follow-up of aortic root replacement and hypertension.  Patient is presently feeling well, without any cardiovascular complaints today.  Patient reports home blood pressure readings averaging 125/70s mmHg.  He does note right hand and arm paresthesias radiating from his shoulder particularly while he is driving which improves with stretching.  Denies chest pain, palpitations, dyspnea, syncope, near syncope, symptoms suggestive of TIA/CVA.  Denies orthopnea, PND, leg swelling.  Patient had previously lost approximately 150 pounds, however he has gained approximately 50-60-60 pounds back since sleeve gastrectomy in 2018.  Patient lives totally sedentary lifestyle, without formal exercise routine.  He works as a Education officer, museum, and states he walks intermittently throughout the day without issue.  Patient continues to follow closely with Dr. Mart Piggs of CT surgery at [facility], he is presently scheduled for repeat cardiac MRI in October 2022.   Past Medical History:  Diagnosis Date   Anxiety    Complication of anesthesia    Depression    Essential hypertension 07/09/2018   GERD (gastroesophageal reflux disease)    Heart murmur    Hyperlipidemia    Hypertension    Marfan syndrome    Marfan syndrome    Obesity    OSA (obstructive sleep apnea)    PONV (postoperative nausea and vomiting)    Sleep apnea    cpap- does not know settings    Tuberculosis    hx fo TB and treated in 2016    Past Surgical History:  Procedure Laterality Date    bilateral foot surgery      CARDIAC VALVE REPLACEMENT     aortic root replacement and valve repair   EYE SURGERY     LAPAROSCOPIC GASTRIC SLEEVE RESECTION N/A 02/07/2017   Procedure: LAPAROSCOPIC GASTRIC SLEEVE RESECTION WITH UPPER ENDO;  Surgeon: Kinsinger, Arta Bruce, MD;  Location: [facility];  Service: General;  Laterality: N/A;   Family History  Problem Relation Age of Onset   Diabetes Mother    Stroke Mother    Hypertension Father    Diabetes Father    Heart attack Father     Social History   Tobacco Use   Smoking status: Never Smoker   Smokeless tobacco: Never Used  Substance Use Topics   Alcohol use: Not Currently    Comment: rare  Marital status: Single  ROS  Review of Systems  Constitutional: Negative for malaise/fatigue and weight gain.  Cardiovascular: Negative for chest pain, claudication, dyspnea on exertion, leg swelling, near-syncope, orthopnea, palpitations, paroxysmal nocturnal dyspnea and syncope.  Respiratory: Negative for shortness of breath.   Hematologic/Lymphatic: Does not bruise/bleed easily.  Gastrointestinal: Negative for melena.  Neurological: Negative for dizziness and weakness.   Objective  Blood pressure 133/80, pulse 63, temperature (!) 97.5 F (36.4 C), height '6\' 9"'  (2.057 m), weight (!) 379 lb (171.9 kg), SpO2 98 %.  Vitals with BMI 07/07/2020 01/11/2020 07/09/2019  Height '6\' 9"'  '6\' 9"'  '6\' 9"'   Weight 379 lbs 375 lbs 377 lbs  BMI 40.63 29.5 18.84  Systolic 166 063 016  Diastolic 80 82 80  Pulse 63 66 65     Physical Exam Constitutional:      Comments: Marfanoid features. Moderately obese  Cardiovascular:     Rate and Rhythm: Normal rate and regular rhythm.     Pulses: Normal pulses and intact distal pulses.          Carotid pulses are on the left side with bruit.    Heart sounds: Murmur heard.   Blowing early systolic murmur is present with a grade of 2/6 at the upper right sternal border. Varicose veins bilateral lower extremity  noted  No gallop.      Comments: Prominent abdominal aortic pulsation noted. No JVD. Trace bilateral leg edema.  Pulmonary:     Effort: Pulmonary effort is normal.     Breath sounds: Normal breath sounds.  Abdominal:     General: Bowel sounds are normal.     Palpations: Abdomen is soft.  Musculoskeletal:     Right lower leg: Edema (trace) present.     Left lower leg: Edema (trace) present.  Skin:    General: Skin is warm and dry.    Laboratory examination:   External labs:  03/05/2020: Glucose 89, BUN 12, creatinine 1.23, GFR 89, sodium 138, potassium 4.5 Total cholesterol 115, triglycerides 54, HDL 47, LDL 56 A1c 5.8%  Care Everywhere Result Report CBC, Platelet; No DifferentialResulted: 06/27/2019 2:35 AM [facility] Component Name Value Ref Range WBC 4.3 3.4 - 10.8 x10E3/uL RBC 5.49 4.14 - 5.8 x10E6/uL Hemoglobin 15.0 13 - 17.7 g/dL Hematocrit 45.9 37.5 - 51 % MCV 84 79 - 97 fL MCH 27.3 26.6 - 33 pg MCHC 32.7 31.5 - 35.7 g/dL RDW 13.3 11.6 - 15.4 % Platelet Count 232 150 - 450 x10E3/uL  Comprehensive metabolic panelResulted: 0/35/4656 2:35 AM [facility] Component Name Value Ref Range Glucose 90 65 - 99 mg/dL BUN 17 6 - 24 mg/dL Creatinine 1.02 0.76 - 1.27 mg/dL eGFR If NonAfrican American 91 >59 mL/min/1.73 eGFR If African American 105 >59 mL/min/1.73 BUN/Creatinine Ratio 17 9 - 20  Sodium 140 134 - 144 mmol/L Potassium 4.8 3.5 - 5.2 mmol/L Chloride 106 96 - 106 mmol/L CO2 23 20 - 29 mmol/L CALCIUM 8.9 8.7 - 10.2 mg/dL Total Protein 6.7 6 - 8.5 g/dL Albumin, Serum 3.8 (L) 4 - 5 g/dL Globulin, Total 2.9 1.5 - 4.5 g/dL Albumin/Globulin Ratio 1.3 1.2 - 2.2  Total Bilirubin 0.7 0 - 1.2 mg/dL Alkaline Phosphatase 81 39 - 117 IU/L AST 22 0 - 40 IU/L ALT (SGPT) 15 0 - 44 IU/L  Lipid Panel With LDL/HDL RatioResulted: 06/27/2019 2:35 AM [facility] Component Name Value Ref Range Cholesterol, Total 120 100 - 199 mg/dL Triglycerides 57 0 - 149  mg/dL HDL 49 >39 mg/dL VLDL Cholesterol Cal 13 5 - 40 mg/dL LDL 58 0 - 99 mg/dL   Medications and allergies  No Known Allergies   Current Outpatient Medications  Medication Instructions   amLODipine (NORVASC) 5 mg, Oral, Daily   atorvastatin (LIPITOR) 20 mg, Oral, BH-each morning   buPROPion (WELLBUTRIN XL) 150 MG 24 hr tablet Oral   celecoxib (CELEBREX) 200 mg, Oral, As needed   famotidine (PEPCID) 40 mg, Oral, Daily   losartan (COZAAR) 100 mg, Oral, Daily   metoprolol succinate (TOPROL-XL) 25 mg, Oral, Daily   Multiple Vitamin (MULTIVITAMIN) tablet 1 tablet, Oral, Daily   omeprazole (PRILOSEC) 40 mg, Oral, Daily   Radiology:   Cardiac and aortic MRI on 03/04/2019: Left ventricle is  moderately enlarged, slightly worse than previous.  Moderate LVH.  Normal LVEF 61%. Right ventricle is in the upper limits of normal.  Normal RV systolic function. Moderately enlarged left atrium. Mild aortic regurgitation. Mild MR.  Aortic root and ascending aorta/graft is normal in size.  Normal thoracic and abdominal aorta. Proximal right common iliac artery is mildly dilated compared to left at 20 and 17 mm.  Cardiac Studies:   Carotid artery duplex 06/04/19:  No hemodynamically significant arterial disease in the internal carotid  artery bilaterally. Minimal soft plaque bilateral carotid arteries.  Antegrade right vertebral artery flow. Antegrade left vertebral artery  Flow.  PCV ECHOCARDIOGRAM 06/30/2018 Poor echo window. Wall motion abnormality has reduced sensitivity. Left ventricle cavity is normal in size. Moderate concentric hypertrophy of the left ventricle. Normal global wall motion. Doppler evidence of grade II (pseudonormal) diastolic dysfunction. Diastolic dysfunction findings suggests elevated LA/LV end diastolic pressure. Calculated EF 55%. Normal aortic valve leaflet mobility. Mild (Grade I) aortic regurgitation. The aortic root is normal in size at 3.8  cm. Compared to the study done on 12/21/2017, previously noted moderate aortic regurgitation and mitral regurgitation no longer present.  Aortic root size appears to be normal for BSA.   EKG   EKG 07/07/2020: Sinus rhythm at 60 bpm with underlying first-degree AV block.  Normal axis.  No evidence of underlying ischemia or injury pattern.   EKG 07/09/2019: Sinus rhythm with first-degree block at rate of 63 bpm, left atrial enlargement, left axis deviation.  Incomplete right bundle branch block.  No evidence of ischemia, normal QT interval.   EKG 07/10/2018: Normal sinus rhythm at rate of 65 bpm, borderline criteria for left atrial enlargement, otherwise normal EKG.  Assessment     ICD-10-CM   1. H/O aortic root repair  Z98.890 EKG 12-Lead  2. OSA (obstructive sleep apnea)  G47.33 metoprolol succinate (TOPROL-XL) 25 MG 24 hr tablet  3. Essential hypertension  I10   4. Marfan's syndrome with aortic dilation  Q87.410     Meds ordered this encounter  Medications   amLODipine (NORVASC) 5 MG tablet    Sig: Take 1 tablet (5 mg total) by mouth daily.    Dispense:  90 tablet    Refill:  3   atorvastatin (LIPITOR) 20 MG tablet    Sig: Take 1 tablet (20 mg total) by mouth every morning.    Dispense:  90 tablet    Refill:  3   losartan (COZAAR) 100 MG tablet    Sig: Take 1 tablet (100 mg total) by mouth daily.    Dispense:  90 tablet    Refill:  3   metoprolol succinate (TOPROL-XL) 25 MG 24 hr tablet    Sig: Take 1 tablet (25 mg total) by mouth daily.    Dispense:  90 tablet    Refill:  3    Medications Discontinued During This Encounter  Medication Reason   amoxicillin-clavulanate (AUGMENTIN) 875-125 MG tablet Completed Course   escitalopram (LEXAPRO) 10 MG tablet Completed Course   atorvastatin (LIPITOR) 20 MG tablet Reorder   losartan (COZAAR) 100 MG tablet Reorder   metoprolol succinate (TOPROL-XL) 25 MG 24 hr tablet Reorder   amLODipine (NORVASC) 5 MG tablet Reorder     Recommendations:  WILLIOM CEDAR  is a 45 y.o. AAM patient with  Marfan syndrome, history of aortic root replacement at [facility] in 2012, history of bilateral retinal detachment, sleeve gastrectomy on 02/07/2017.   Patient presents for annual follow-up  of aortic root replacement and hypertension.  Patient continues to do well, remains asymptomatic.  Unfortunately he has gained significant weight back since sleeve ectomy in 2018.  Again discussed at length regarding importance of diet and lifestyle modifications, particularly weight loss.  I have personally reviewed external labs, lipids remain well controlled.  Blood pressure is minimally elevated in the office today, however patient brings with him home blood pressure recordings which are well controlled.  Will not make changes to antihypertensive medications at this time.  In view of aortic pathology patient is presently on ARB and beta-blocker therapy, will continue this.  There is no clinical evidence of heart failure.  Patient will continue to follow with [facility] for cardiac MRI and overall follow-up of aortic root replacement.  Reviewed and discussed with patient results of echocardiogram and carotid artery duplex, details above.   Follow-up in 1 year, sooner if needed, for aortic root replacement and hypertension.   Alethia Berthold, PA-C 07/08/2020, 10:45 AM Office: [phone]

## 2020-08-29 ENCOUNTER — Encounter (HOSPITAL_COMMUNITY): Payer: Self-pay | Admitting: *Deleted

## 2021-07-08 NOTE — Progress Notes (Signed)
Primary Physician/Referring:  Margarito Courser, MD  Patient ID: Robert Stevenson, male    DOB: 09-11-1975, 46 y.o.   MRN: [id number]  Chief Complaint  Patient presents with   Follow-up    1 year   aortic root replacement   Hypertension   HPI:    Robert Stevenson  is a 46 y.o. AAM patient with  Marfan syndrome,  history of aortic root replacement at [facility] in 2012, history of bilateral retinal detachment, sleeve gastrectomy on 02/07/2017.  Patient presents for annual follow-up of aortic root replacement and hypertension.  Notably patient continues to follow with [facility] for aortic root replacement and cardiac MRI.  Patient is feeling well overall without specific complaints.  Blood pressure is well controlled and lipids are well controlled.  Patient remains consistent with CPAP nightly, and is titration study annually with sleep studies.  Unfortunately he has continued to gain weight since last office visit.  Denies chest pain, palpitations, syncope, near syncope, dyspnea.  Unfortunately patient is relatively sedentary.  Patient continues to follow closely with Dr. Mart Piggs of CT surgery at [facility], he is presently scheduled for repeat cardiac MRI in November 2023.  Past Medical History:  Diagnosis Date   Anxiety    Complication of anesthesia    Depression    Essential hypertension 07/09/2018   GERD (gastroesophageal reflux disease)    Heart murmur    Hyperlipidemia    Hypertension    Marfan syndrome    Marfan syndrome    Obesity    OSA (obstructive sleep apnea)    PONV (postoperative nausea and vomiting)    Sleep apnea    cpap- does not know settings    Tuberculosis    hx fo TB and treated in 2016    Past Surgical History:  Procedure Laterality Date   bilateral foot surgery      CARDIAC VALVE REPLACEMENT     aortic root replacement and valve repair   EYE SURGERY     LAPAROSCOPIC GASTRIC SLEEVE RESECTION N/A 02/07/2017   Procedure: LAPAROSCOPIC  GASTRIC SLEEVE RESECTION WITH UPPER ENDO;  Surgeon: Kinsinger, Arta Bruce, MD;  Location: [facility];  Service: General;  Laterality: N/A;   Family History  Problem Relation Age of Onset   Diabetes Mother    Stroke Mother    Hypertension Father    Diabetes Father    Heart attack Father     Social History   Tobacco Use   Smoking status: Never   Smokeless tobacco: Never  Substance Use Topics   Alcohol use: Not Currently    Comment: rare  Marital status: Single  ROS  Review of Systems  Constitutional: Positive for weight gain.  Cardiovascular:  Negative for chest pain, claudication, dyspnea on exertion, leg swelling, near-syncope, orthopnea, palpitations, paroxysmal nocturnal dyspnea and syncope.  Respiratory:  Negative for shortness of breath.   Neurological:  Negative for dizziness.  Objective  Blood pressure 127/82, pulse 83, temperature 98.5 F (36.9 C), temperature source Temporal, resp. rate 17, height '6\' 9"'  (2.057 m), weight (!) 404 lb 12.8 oz (183.6 kg), SpO2 98 %.  Vitals with BMI 07/09/2021 07/07/2020 01/11/2020  Height '6\' 9"'  '6\' 9"'  '6\' 9"'   Weight 404 lbs 13 oz 379 lbs 375 lbs  BMI 43.4 09.47 09.6  Systolic 283 662 947  Diastolic 82 80 82  Pulse 83 63 66     Physical Exam Vitals reviewed.  Constitutional:      Appearance: He  is obese.     Comments: Marfanoid features. Moderately obese  Cardiovascular:     Rate and Rhythm: Normal rate and regular rhythm.     Pulses: Normal pulses and intact distal pulses.          Carotid pulses are  on the left side with bruit.    Heart sounds: Murmur heard.  Blowing early systolic murmur is present with a grade of 2/6 at the upper right sternal border. Varicose veins bilateral lower extremity noted    No gallop.     Comments: Prominent abdominal aortic pulsation noted. No JVD. Trace bilateral leg edema.  Pulmonary:     Effort: Pulmonary effort is normal.     Breath sounds: Normal breath sounds.  Musculoskeletal:     Right lower  leg: Edema (trace) present.     Left lower leg: Edema (trace) present.   Laboratory examination:   External labs:  02/09/2021: BUN 12, creatinine 1.01, GFR >60, sodium 139, potassium 4.7 Total cholesterol 118, triglycerides 52, HDL 52, LDL 54 Hgb 14.4, HCT 44.6, MCV 83, platelet 248 TSH 2.69 A1c 5.8%  03/05/2020: Glucose 89, BUN 12, creatinine 1.23, GFR 89, sodium 138, potassium 4.5 Total cholesterol 115, triglycerides 54, HDL 47, LDL 56 A1c 5.8%  Care Everywhere Result Report CBC, Platelet; No DifferentialResulted: 06/27/2019 2:35 AM [facility] Component Name Value Ref Range WBC 4.3 3.4 - 10.8 x10E3/uL RBC 5.49 4.14 - 5.8 x10E6/uL Hemoglobin 15.0 13 - 17.7 g/dL Hematocrit 45.9 37.5 - 51 % MCV 84 79 - 97 fL MCH 27.3 26.6 - 33 pg MCHC 32.7 31.5 - 35.7 g/dL RDW 13.3 11.6 - 15.4 % Platelet Count 232 150 - 450 x10E3/uL  Comprehensive metabolic panelResulted: 2/83/6629 2:35 AM [facility] Component Name Value Ref Range Glucose 90 65 - 99 mg/dL BUN 17 6 - 24 mg/dL Creatinine 1.02 0.76 - 1.27 mg/dL eGFR If NonAfrican American 91 >59 mL/min/1.73 eGFR If African American 105 >59 mL/min/1.73 BUN/Creatinine Ratio 17 9 - 20  Sodium 140 134 - 144 mmol/L Potassium 4.8 3.5 - 5.2 mmol/L Chloride 106 96 - 106 mmol/L CO2 23 20 - 29 mmol/L CALCIUM 8.9 8.7 - 10.2 mg/dL Total Protein 6.7 6 - 8.5 g/dL Albumin, Serum 3.8 (L) 4 - 5 g/dL Globulin, Total 2.9 1.5 - 4.5 g/dL Albumin/Globulin Ratio 1.3 1.2 - 2.2  Total Bilirubin 0.7 0 - 1.2 mg/dL Alkaline Phosphatase 81 39 - 117 IU/L AST 22 0 - 40 IU/L ALT (SGPT) 15 0 - 44 IU/L  Lipid Panel With LDL/HDL RatioResulted: 06/27/2019 2:35 AM [facility] Component Name Value Ref Range Cholesterol, Total 120 100 - 199 mg/dL Triglycerides 57 0 - 149 mg/dL HDL 49 >39 mg/dL VLDL Cholesterol Cal 13 5 - 40 mg/dL LDL 58 0 - 99 mg/dL  Allergies  No Known Allergies   Medications Prior to Visit:   Outpatient Medications Prior to Visit   Medication Sig Dispense Refill   amLODipine (NORVASC) 5 MG tablet Take 1 tablet (5 mg total) by mouth daily. 90 tablet 3   atorvastatin (LIPITOR) 20 MG tablet Take 1 tablet (20 mg total) by mouth every morning. 90 tablet 3   escitalopram (LEXAPRO) 10 MG tablet Take 10 mg by mouth daily.     famotidine (PEPCID) 40 MG tablet Take 40 mg by mouth daily.     losartan (COZAAR) 100 MG tablet Take 1 tablet (100 mg total) by mouth daily. 90 tablet 3   metoprolol succinate (TOPROL-XL) 25 MG 24 hr tablet Take  1 tablet (25 mg total) by mouth daily. 90 tablet 3   Multiple Vitamin (MULTIVITAMIN) tablet Take 1 tablet by mouth daily.     pantoprazole (PROTONIX) 40 MG tablet Take 40 mg by mouth daily.     REXULTI 0.5 MG TABS Take 1 tablet by mouth daily.     tiZANidine (ZANAFLEX) 4 MG tablet Take 4 mg by mouth at bedtime as needed.     traZODone (DESYREL) 50 MG tablet Take 50 mg by mouth at bedtime.     WEGOVY 0.25 MG/0.5ML SOAJ Inject into the skin.     omeprazole (PRILOSEC) 40 MG capsule Take 40 mg by mouth daily.     buPROPion (WELLBUTRIN XL) 150 MG 24 hr tablet Take by mouth.     celecoxib (CELEBREX) 200 MG capsule Take 200 mg by mouth as needed. (Patient not taking: Reported on 07/09/2021)     No facility-administered medications prior to visit.   Final Medications at End of Visit    Current Meds  Medication Sig   amLODipine (NORVASC) 5 MG tablet Take 1 tablet (5 mg total) by mouth daily.   atorvastatin (LIPITOR) 20 MG tablet Take 1 tablet (20 mg total) by mouth every morning.   escitalopram (LEXAPRO) 10 MG tablet Take 10 mg by mouth daily.   famotidine (PEPCID) 40 MG tablet Take 40 mg by mouth daily.   losartan (COZAAR) 100 MG tablet Take 1 tablet (100 mg total) by mouth daily.   metoprolol succinate (TOPROL-XL) 25 MG 24 hr tablet Take 1 tablet (25 mg total) by mouth daily.   Multiple Vitamin (MULTIVITAMIN) tablet Take 1 tablet by mouth daily.   pantoprazole (PROTONIX) 40 MG tablet Take 40 mg by  mouth daily.   REXULTI 0.5 MG TABS Take 1 tablet by mouth daily.   tiZANidine (ZANAFLEX) 4 MG tablet Take 4 mg by mouth at bedtime as needed.   traZODone (DESYREL) 50 MG tablet Take 50 mg by mouth at bedtime.   WEGOVY 0.25 MG/0.5ML SOAJ Inject into the skin.   [DISCONTINUED] omeprazole (PRILOSEC) 40 MG capsule Take 40 mg by mouth daily.   Radiology:  Cardiac and aortic MRI on 03/19/2021: Mild LV enlargment with normal LV systolic function and no evidence of MI or myocardial scarring. Mild aortic stenosis but peak velocity is increased compared with prior scan (2.9 m/s versus 2.2 m/s). S/P valve sparing hemiarch graft repair of the ascending thoracic aorta, unchanged compared to prior.   Cardiac and aortic MRI on 03/04/2019: Left ventricle is moderately enlarged, slightly worse than previous.  Moderate LVH.  Normal LVEF 61%. Right ventricle is in the upper limits of normal.  Normal RV systolic function. Moderately enlarged left atrium. Mild aortic regurgitation. Mild MR.  Aortic root and ascending aorta/graft is normal in size.  Normal thoracic and abdominal aorta. Proximal right common iliac artery is mildly dilated compared to left at 20 and 17 mm.  Cardiac Studies:   Carotid artery duplex  05/24/2019:  No hemodynamically significant arterial disease in the internal carotid  artery bilaterally. Minimal soft plaque bilateral carotid arteries.  Antegrade right vertebral artery flow. Antegrade left vertebral artery  Flow.  PCV ECHOCARDIOGRAM 06/30/2018 Poor echo window. Wall motion abnormality has reduced sensitivity. Left ventricle cavity is normal in size. Moderate concentric hypertrophy of the left ventricle. Normal global wall motion. Doppler evidence of grade II (pseudonormal) diastolic dysfunction. Diastolic dysfunction findings suggests elevated LA/LV end diastolic pressure. Calculated EF 55%. Normal aortic valve leaflet mobility. Mild (Grade I) aortic  regurgitation. The aortic  root is normal in size at 3.8 cm. Compared to the study done on 12/21/2017, previously noted moderate aortic regurgitation and mitral regurgitation no longer present.  Aortic root size appears to be normal for BSA.   EKG  07/09/2021: Sinus rhythm at a rate of 68 bpm with underlying first-degree AV block.  Normal axis.  Nonspecific T wave abnormality.  No evidence of ischemia or underlying injury pattern.  07/09/2019: Sinus rhythm with first-degree block at rate of 63 bpm, left atrial enlargement, left axis deviation.  Incomplete right bundle branch block.  No evidence of ischemia, normal QT interval.   EKG 07/10/2018: Normal sinus rhythm at rate of 65 bpm, borderline criteria for left atrial enlargement, otherwise normal EKG.  Assessment     ICD-10-CM   1. H/O aortic root repair  Z98.890     2. Essential hypertension  I10 EKG 12-Lead    3. Marfan's syndrome with aortic dilation  Q87.410       No orders of the defined types were placed in this encounter.   Medications Discontinued During This Encounter  Medication Reason   omeprazole (PRILOSEC) 40 MG capsule Duplicate    Recommendations:  Robert Stevenson  is a 46 y.o. AAM patient with  Marfan syndrome,  history of aortic root replacement at [facility] in 2012, history of bilateral retinal detachment, sleeve gastrectomy on 02/07/2017.   Patient presents for annual follow-up of aortic root replacement and hypertension.  Notably patient continues to follow with [facility] for aortic root replacement and cardiac MRI.  In view of aortic pathology patient remains on ARB and beta-blocker therapy, will continue this.  Blood pressure is well controlled.  I personally reviewed external labs, lipids are well controlled.  Unfortunately patient has continued to gain weight since last office visit, therefore reiterated the importance of weight loss and increasing physical activity.  EKG and physical exam remained unchanged compared  to previous office visit.  Follow-up in 1 year, sooner if needed.   Alethia Berthold, PA-C 07/09/2021, 9:00 AM Office: [phone]

## 2021-07-09 ENCOUNTER — Encounter: Payer: Self-pay | Admitting: Student

## 2021-07-09 ENCOUNTER — Ambulatory Visit: Payer: Managed Care, Other (non HMO) | Admitting: Student

## 2021-07-09 ENCOUNTER — Other Ambulatory Visit: Payer: Self-pay

## 2021-07-09 VITALS — BP 127/82 | HR 83 | Temp 98.5°F | Resp 17 | Ht >= 80 in | Wt >= 6400 oz

## 2021-07-09 DIAGNOSIS — Q87410 Marfan's syndrome with aortic dilation: Secondary | ICD-10-CM

## 2021-07-09 DIAGNOSIS — Z9889 Other specified postprocedural states: Secondary | ICD-10-CM

## 2021-07-09 DIAGNOSIS — I10 Essential (primary) hypertension: Secondary | ICD-10-CM

## 2021-07-15 ENCOUNTER — Other Ambulatory Visit: Payer: Self-pay | Admitting: Student

## 2021-07-15 DIAGNOSIS — G4733 Obstructive sleep apnea (adult) (pediatric): Secondary | ICD-10-CM

## 2021-07-16 ENCOUNTER — Other Ambulatory Visit: Payer: Self-pay | Admitting: Student

## 2021-08-21 ENCOUNTER — Other Ambulatory Visit: Payer: Self-pay | Admitting: Orthopedic Surgery

## 2021-08-21 DIAGNOSIS — G8929 Other chronic pain: Secondary | ICD-10-CM

## 2021-09-11 ENCOUNTER — Ambulatory Visit
Admission: RE | Admit: 2021-09-11 | Discharge: 2021-09-11 | Disposition: A | Discharge status: Home / Self Care | Payer: Managed Care, Other (non HMO) | Source: Ambulatory Visit | Attending: Orthopedic Surgery | Admitting: Orthopedic Surgery

## 2021-09-11 DIAGNOSIS — G8929 Other chronic pain: Secondary | ICD-10-CM

## 2022-07-14 ENCOUNTER — Ambulatory Visit: Payer: Managed Care, Other (non HMO) | Admitting: Student

## 2022-08-26 ENCOUNTER — Ambulatory Visit: Payer: Self-pay | Admitting: Podiatry

## 2022-09-09 ENCOUNTER — Encounter (HOSPITAL_COMMUNITY): Payer: Self-pay | Admitting: *Deleted

## 2022-09-09 ENCOUNTER — Encounter: Payer: Self-pay | Admitting: Podiatry

## 2022-09-09 ENCOUNTER — Ambulatory Visit (INDEPENDENT_AMBULATORY_CARE_PROVIDER_SITE_OTHER): Payer: Managed Care, Other (non HMO) | Admitting: Podiatry

## 2022-09-09 DIAGNOSIS — Q828 Other specified congenital malformations of skin: Secondary | ICD-10-CM

## 2022-09-09 NOTE — Progress Notes (Signed)
Subjective:  Patient ID: Robert Stevenson, male    DOB: 13-Jun-1975,  MRN: [id number]  Chief Complaint  Patient presents with   Nail Problem    "This big toenail on my left foot that I had removed is growing back.  It bleeds when I pick it.  I have a callus that I have to get trimmed on a regular basis."      47 y.o. male presents with the above complaint.  Patient presents with left submetatarsal 1 and submetatarsal 5 porokeratotic lesion painful to touch is progressive gotten worse hurts with ambulation hurts with pressure he wanted to get it evaluated make sure that there is nothing going on.  He denies seeing anyone else prior to seeing me.  He used to have a podiatrist but he would like to have a podiatrist will be closer to home.   Review of Systems: Negative except as noted in the HPI. Denies N/V/F/Ch.  Past Medical History:  Diagnosis Date   Anxiety    Complication of anesthesia    Depression    Essential hypertension 07/09/2018   GERD (gastroesophageal reflux disease)    Heart murmur    Hyperlipidemia    Hypertension    Marfan syndrome    Marfan syndrome    Obesity    OSA (obstructive sleep apnea)    PONV (postoperative nausea and vomiting)    Sleep apnea    cpap- does not know settings    Tuberculosis    hx fo TB and treated in 2016     Current Outpatient Medications:    amLODipine (NORVASC) 5 MG tablet, TAKE 1 TABLET (5 MG TOTAL) BY MOUTH DAILY., Disp: 30 tablet, Rfl: 11   atorvastatin (LIPITOR) 20 MG tablet, TAKE 1 TABLET BY MOUTH EVERY DAY IN THE MORNING, Disp: 30 tablet, Rfl: 11   buPROPion (WELLBUTRIN XL) 150 MG 24 hr tablet, Take by mouth., Disp: , Rfl:    escitalopram (LEXAPRO) 10 MG tablet, Take 10 mg by mouth daily., Disp: , Rfl:    losartan (COZAAR) 100 MG tablet, TAKE 1 TABLET BY MOUTH EVERY DAY, Disp: 30 tablet, Rfl: 11   metoprolol succinate (TOPROL-XL) 25 MG 24 hr tablet, TAKE 1 TABLET (25 MG TOTAL) BY MOUTH DAILY., Disp: 30 tablet, Rfl: 11   Multiple  Vitamin (MULTIVITAMIN) tablet, Take 1 tablet by mouth daily., Disp: , Rfl:    pantoprazole (PROTONIX) 40 MG tablet, Take 40 mg by mouth daily., Disp: , Rfl:    tiZANidine (ZANAFLEX) 4 MG tablet, Take 4 mg by mouth at bedtime as needed., Disp: , Rfl:    traZODone (DESYREL) 50 MG tablet, Take 50 mg by mouth at bedtime., Disp: , Rfl:    WEGOVY 0.25 MG/0.5ML SOAJ, Inject into the skin., Disp: , Rfl:    famotidine (PEPCID) 40 MG tablet, Take 40 mg by mouth daily., Disp: , Rfl:    REXULTI 0.5 MG TABS, Take 1 tablet by mouth daily., Disp: , Rfl:   Social History   Tobacco Use  Smoking Status Never  Smokeless Tobacco Never    No Known Allergies Objective:  There were no vitals filed for this visit. There is no height or weight on file to calculate BMI. Constitutional Well developed. Well nourished.  Vascular Dorsalis pedis pulses palpable bilaterally. Posterior tibial pulses palpable bilaterally. Capillary refill normal to all digits.  No cyanosis or clubbing noted. Pedal hair growth normal.  Neurologic Normal speech. Oriented to person, place, and time. Epicritic sensation to light touch  grossly present bilaterally.  Dermatologic Left submetatarsal 1 and 5 porokeratotic lesion.  Pain on palpation to the lesion central nucleated core noted.  Upon debridement no pinpoint bleeding noted  Orthopedic: Normal joint ROM without pain or crepitus bilaterally. No visible deformities. No bony tenderness.   Radiographs: None Assessment:   1. Porokeratosis    Plan:  Patient was evaluated and treated and all questions answered.  Left submetatarsal 1 porokeratotic lesion and 5 -All questions and concerns were discussed with the patient in extensive detail -Given the amount of pain that is having using chisel blade handle the lesion was debrided down to healthy scar tissue.  No complication noted no pinpoint bleeding noted. -I discussed shoe gear modification with them as well.  No follow-ups  on file.

## 2023-04-15 ENCOUNTER — Emergency Department (HOSPITAL_COMMUNITY): Payer: BC Managed Care – PPO

## 2023-04-15 ENCOUNTER — Encounter (HOSPITAL_COMMUNITY): Payer: Self-pay

## 2023-04-15 ENCOUNTER — Emergency Department (HOSPITAL_COMMUNITY)
Admission: EM | Admit: 2023-04-15 | Discharge: 2023-04-15 | Disposition: A | Discharge status: Other Acute Inpatient Hospital | Payer: BC Managed Care – PPO | Attending: Emergency Medicine | Admitting: Emergency Medicine

## 2023-04-15 ENCOUNTER — Other Ambulatory Visit: Payer: Self-pay

## 2023-04-15 DIAGNOSIS — I7103 Dissection of thoracoabdominal aorta: Secondary | ICD-10-CM | POA: Diagnosis not present

## 2023-04-15 DIAGNOSIS — I10 Essential (primary) hypertension: Secondary | ICD-10-CM | POA: Insufficient documentation

## 2023-04-15 DIAGNOSIS — R079 Chest pain, unspecified: Secondary | ICD-10-CM | POA: Diagnosis present

## 2023-04-15 DIAGNOSIS — Z79899 Other long term (current) drug therapy: Secondary | ICD-10-CM | POA: Insufficient documentation

## 2023-04-15 DIAGNOSIS — R600 Localized edema: Secondary | ICD-10-CM | POA: Diagnosis not present

## 2023-04-15 LAB — CBC WITH DIFFERENTIAL/PLATELET
Abs Immature Granulocytes: 0.01 10*3/uL (ref 0.00–0.07)
Basophils Absolute: 0 10*3/uL (ref 0.0–0.1)
Basophils Relative: 1 %
Eosinophils Absolute: 0.3 10*3/uL (ref 0.0–0.5)
Eosinophils Relative: 6 %
HCT: 40.6 % (ref 39.0–52.0)
Hemoglobin: 13.1 g/dL (ref 13.0–17.0)
Immature Granulocytes: 0 %
Lymphocytes Relative: 39 %
Lymphs Abs: 1.7 10*3/uL (ref 0.7–4.0)
MCH: 27.3 pg (ref 26.0–34.0)
MCHC: 32.3 g/dL (ref 30.0–36.0)
MCV: 84.6 fL (ref 80.0–100.0)
Monocytes Absolute: 0.4 10*3/uL (ref 0.1–1.0)
Monocytes Relative: 8 %
Neutro Abs: 2 10*3/uL (ref 1.7–7.7)
Neutrophils Relative %: 46 %
Platelets: 225 10*3/uL (ref 150–400)
RBC: 4.8 MIL/uL (ref 4.22–5.81)
RDW: 14.2 % (ref 11.5–15.5)
WBC: 4.3 10*3/uL (ref 4.0–10.5)
nRBC: 0 % (ref 0.0–0.2)

## 2023-04-15 LAB — COMPREHENSIVE METABOLIC PANEL
ALT: 23 U/L (ref 0–44)
AST: 29 U/L (ref 15–41)
Albumin: 3.2 g/dL — ABNORMAL LOW (ref 3.5–5.0)
Alkaline Phosphatase: 63 U/L (ref 38–126)
Anion gap: 8 (ref 5–15)
BUN: 17 mg/dL (ref 6–20)
CO2: 23 mmol/L (ref 22–32)
Calcium: 8.5 mg/dL — ABNORMAL LOW (ref 8.9–10.3)
Chloride: 106 mmol/L (ref 98–111)
Creatinine, Ser: 0.89 mg/dL (ref 0.61–1.24)
GFR, Estimated: >60 mL/min (ref >60)
Glucose, Bld: 111 mg/dL — ABNORMAL HIGH (ref 70–99)
Potassium: 3.9 mmol/L (ref 3.5–5.1)
Sodium: 137 mmol/L (ref 135–145)
Total Bilirubin: 0.8 mg/dL (ref <1.2)
Total Protein: 6.6 g/dL (ref 6.5–8.1)

## 2023-04-15 LAB — D-DIMER, QUANTITATIVE: D-Dimer, Quant: 3.85 ug{FEU}/mL — ABNORMAL HIGH (ref 0.00–0.50)

## 2023-04-15 LAB — TROPONIN I (HIGH SENSITIVITY)
Troponin I (High Sensitivity): 6 ng/L (ref <18)
Troponin I (High Sensitivity): 4 ng/L (ref <18)

## 2023-04-15 LAB — I-STAT CHEM 8, ED
BUN: 16 mg/dL (ref 6–20)
Calcium, Ion: 1.18 mmol/L (ref 1.15–1.40)
Chloride: 103 mmol/L (ref 98–111)
Creatinine, Ser: 1.1 mg/dL (ref 0.61–1.24)
Glucose, Bld: 113 mg/dL — ABNORMAL HIGH (ref 70–99)
HCT: 39 % (ref 39.0–52.0)
Hemoglobin: 13.3 g/dL (ref 13.0–17.0)
Potassium: 4 mmol/L (ref 3.5–5.1)
Sodium: 140 mmol/L (ref 135–145)
TCO2: 23 mmol/L (ref 22–32)

## 2023-04-15 LAB — LIPASE, BLOOD: Lipase: 30 U/L (ref 11–51)

## 2023-04-15 MED ORDER — MORPHINE SULFATE (PF) 4 MG/ML IV SOLN
4.0000 mg | Freq: Once | INTRAVENOUS | Status: AC
  Administered 2023-04-15: 4 mg via INTRAVENOUS
  Filled 2023-04-15: qty 1

## 2023-04-15 MED ORDER — IOHEXOL 350 MG/ML SOLN
100.0000 mL | Freq: Once | INTRAVENOUS | Status: AC | PRN
  Administered 2023-04-15: 100 mL via INTRAVENOUS

## 2023-04-15 MED ORDER — MORPHINE SULFATE (PF) 4 MG/ML IV SOLN
4.0000 mg | Freq: Once | INTRAVENOUS | Status: AC
Start: 2023-04-15 — End: 2023-04-15
  Administered 2023-04-15: 4 mg via INTRAVENOUS
  Filled 2023-04-15: qty 1

## 2023-04-15 MED ORDER — ONDANSETRON HCL 4 MG/2ML IJ SOLN
4.0000 mg | Freq: Once | INTRAMUSCULAR | Status: AC
  Administered 2023-04-15: 4 mg via INTRAVENOUS
  Filled 2023-04-15: qty 2

## 2023-04-15 MED ORDER — MORPHINE SULFATE (PF) 4 MG/ML IV SOLN
4.0000 mg | INTRAVENOUS | Status: DC | PRN
  Administered 2023-04-15: 4 mg via INTRAVENOUS
  Filled 2023-04-15: qty 1

## 2023-04-15 MED ORDER — SODIUM CHLORIDE (PF) 0.9 % IJ SOLN
INTRAMUSCULAR | Status: AC
  Filled 2023-04-15: qty 50

## 2023-04-15 MED ORDER — NICARDIPINE HCL IN NACL 20-0.86 MG/200ML-% IV SOLN
3.0000 mg/h | INTRAVENOUS | Status: DC
  Administered 2023-04-15: 5 mg/h via INTRAVENOUS
  Filled 2023-04-15 (×2): qty 200

## 2023-04-15 NOTE — ED Triage Notes (Addendum)
Pt arrived reporting sharp chest pain that started this morning, sudden after dropping son to school. Pain radiating to back intermittently. Hx of Marfan syndrome. No other symptoms at this time

## 2023-04-15 NOTE — ED Provider Notes (Signed)
[place] EMERGENCY DEPARTMENT AT [facility] Provider Note   CSN: [id number] Arrival date & time: 04/15/23  0756     History  Chief Complaint  Patient presents with   Chest Pain    Robert Stevenson is a 47 y.o. male.  Patient is a 47 year old male with a history of hypertension, hyperlipidemia, Marfan syndrome status post aortic root repair and valve repair, GERD, status post gastric bypass who is presenting today with complaint of chest and back pain.  Patient reports waking up and feeling fine this morning but shortly after dropping his child at school he developed a very uncomfortable pain in the center of his chest that went into his back.  He reports it is worse when he takes a deep breath.  Also has had recurrent burping and some intermittent nausea but denies any vomiting.  Does state he had a little bit of upper abdominal pain as well.  No diarrhea.  Also patient notes that he was recently in [place] and got back on a plane flight yesterday.  The flight was approximately 4 hours.  He has not had any pain or swelling in 1 leg.  Denies any cough, congestion or flulike symptoms.  He has been compliant with all of his medication and has had no recent change in medication.  He reports he gets a cardiac MRI every other year to monitor his aorta and it has been normal.  He saw his cardiologist last month and reports everything was going fine.  He takes no anticoagulation.  The history is provided by the patient.  Chest Pain      Home Medications Prior to Admission medications   Medication Sig Start Date End Date Taking? Authorizing Provider  amLODipine (NORVASC) 5 MG tablet TAKE 1 TABLET (5 MG TOTAL) BY MOUTH DAILY. 07/15/21   Cantwell, Celeste C, PA-C  atorvastatin (LIPITOR) 20 MG tablet TAKE 1 TABLET BY MOUTH EVERY DAY IN THE MORNING 07/15/21   Cantwell, Celeste C, PA-C  buPROPion (WELLBUTRIN XL) 150 MG 24 hr tablet Take by mouth. 11/15/19   [provider]   escitalopram (LEXAPRO) 10 MG tablet Take 10 mg by mouth daily. 06/21/21   [provider]  famotidine (PEPCID) 40 MG tablet Take 40 mg by mouth daily.    [provider]  losartan (COZAAR) 100 MG tablet TAKE 1 TABLET BY MOUTH EVERY DAY 07/16/21   Cantwell, Celeste C, PA-C  metoprolol succinate (TOPROL-XL) 25 MG 24 hr tablet TAKE 1 TABLET (25 MG TOTAL) BY MOUTH DAILY. 07/15/21   Cantwell, Celeste C, PA-C  Multiple Vitamin (MULTIVITAMIN) tablet Take 1 tablet by mouth daily.    [provider]  pantoprazole (PROTONIX) 40 MG tablet Take 40 mg by mouth daily. 06/17/21   [provider]  REXULTI 0.5 MG TABS Take 1 tablet by mouth daily. 04/03/21   [provider]  tiZANidine (ZANAFLEX) 4 MG tablet Take 4 mg by mouth at bedtime as needed. 06/11/21   [provider]  traZODone (DESYREL) 50 MG tablet Take 50 mg by mouth at bedtime. 06/18/21   [provider]  WEGOVY 0.25 MG/0.5ML SOAJ Inject into the skin. 05/12/21   [provider]      Allergies    Patient has no known allergies.    Review of Systems   Review of Systems  Cardiovascular:  Positive for chest pain.    Physical Exam Updated Vital Signs BP (!) 147/60   Pulse (!) 57  Resp 15   Ht 6\' 9"  (2.057 m)   Wt (!) 183.6 kg   SpO2 100%   BMI 43.37 kg/m  Physical Exam Vitals and nursing note reviewed.  Constitutional:      General: He is not in acute distress.    Appearance: He is well-developed.     Comments: Appears uncomfortable  HENT:     Head: Normocephalic and atraumatic.     Mouth/Throat:     Mouth: Mucous membranes are moist.  Eyes:     Conjunctiva/sclera: Conjunctivae normal.     Pupils: Pupils are equal, round, and reactive to light.  Cardiovascular:     Rate and Rhythm: Normal rate and regular rhythm.     Pulses: Normal pulses.     Heart sounds: No murmur heard. Pulmonary:     Effort: Pulmonary effort is normal. No respiratory distress.     Breath  sounds: Normal breath sounds. No decreased breath sounds, wheezing, rhonchi or rales.  Abdominal:     General: There is no distension.     Palpations: Abdomen is soft.     Tenderness: There is abdominal tenderness in the right upper quadrant, epigastric area and left upper quadrant. There is no guarding or rebound.  Musculoskeletal:        General: No tenderness. Normal range of motion.     Cervical back: Normal range of motion and neck supple.     Right lower leg: Edema present.     Left lower leg: Edema present.     Comments: Trace edema bilateral ankles.  No calf pain or swelling  Skin:    General: Skin is warm and dry.     Findings: No erythema or rash.  Neurological:     Mental Status: He is alert and oriented to person, place, and time. Mental status is at baseline.  Psychiatric:        Mood and Affect: Mood normal.        Behavior: Behavior normal.     ED Results / Procedures / Treatments   Labs (all labs ordered are listed, but only abnormal results are displayed) Labs Reviewed  D-DIMER, QUANTITATIVE - Abnormal; Notable for the following components:      Result Value   D-Dimer, Quant 3.85 (*)    All other components within normal limits  I-STAT CHEM 8, ED - Abnormal; Notable for the following components:   Glucose, Bld 113 (*)    All other components within normal limits  CBC WITH DIFFERENTIAL/PLATELET  COMPREHENSIVE METABOLIC PANEL  LIPASE, BLOOD  TROPONIN I (HIGH SENSITIVITY)  TROPONIN I (HIGH SENSITIVITY)    EKG EKG Interpretation Date/Time:  Friday April 15 2023 08:49:38 EST Ventricular Rate:  60 PR Interval:  211 QRS Duration:  105 QT Interval:  421 QTC Calculation: 421 R Axis:   -20  Text Interpretation: Sinus or ectopic atrial rhythm Prolonged PR interval Borderline left axis deviation Low voltage, extremity and precordial leads Abnormal R-wave progression, early transition No significant change since last tracing Confirmed by Gwyneth Sprout  [phone]) on 04/15/2023 9:34:14 AM  Radiology CT Angio Chest/Abd/Pel for Dissection W and/or Wo Contrast Result Date: 04/15/2023 CLINICAL DATA:  History of Marfan syndrome status post aortic root replacement presenting with acute onset sharp chest pain radiating to the back EXAM: CT ANGIOGRAPHY CHEST, ABDOMEN AND PELVIS TECHNIQUE: Non-contrast CT of the chest was initially obtained. Multidetector CT imaging through the chest, abdomen and pelvis was performed using the standard protocol during bolus administration of intravenous  contrast. Multiplanar reconstructed images and MIPs were obtained and reviewed to evaluate the vascular anatomy. RADIATION DOSE REDUCTION: This exam was performed according to the departmental dose-optimization program which includes automated exposure control, adjustment of the mA and/or kV according to patient size and/or use of iterative reconstruction technique. CONTRAST:  OMNIPAQUE IOHEXOL 350 MG/ML SOLN COMPARISON:  CT chest and abdomen dated 07/11/2010 FINDINGS: CTA CHEST FINDINGS Cardiovascular: Postsurgical changes of the ascending aorta. Punctate focus of calcification in the left anterior descending coronary artery. Preferential opacification of the thoracic aorta. Anatomic variant common origin of the brachiocephalic and common carotid arteries. 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 Moderate effacement of the true lumen the level of the mid descending aorta, which becomes severely effaced at the level of the distal descending aorta and diaphragmatic hiatus. Normal heart size. No pericardial effusion. No central pulmonary emboli. Mediastinum/Nodes: Imaged thyroid gland without nodules meeting criteria for imaging follow-up by size. Normal esophagus. Right axillary lymphadenopathy measuring up to 13 mm (10:54).  Lungs/Pleura: The central airways are patent. No focal consolidation. No pneumothorax. No pleural effusion. Musculoskeletal: No acute or abnormal lytic or blastic osseous lesions. Median sternotomy wires are nondisplaced. Multilevel degenerative changes of the thoracic spine. Cutaneous thickening along the left lower anterolateral chest wall (10:127) with underlying subcutaneous soft tissue stranding. Review of the MIP images confirms the above findings. CTA ABDOMEN AND PELVIS FINDINGS VASCULAR Aorta: Aortic dissection flap extends into the diffusely dilated abdominal aorta as above. True luminal narrowing slightly improves at the level of the suprarenal aorta, however increases to severe narrowing at the level of the renal arteries. Luminal narrowing improves in the infrarenal abdominal aorta, where appears mild-to-moderately narrowed. Celiac: Celiac artery is patent and arises from the true lumen. SMA: SMA is patent and arises from the true lumen. Renals: There is enhancement of bilateral renal arteries. Two right renal arteries both originate from the true lumen, while the left renal artery arises from the false lumen. IMA: IMA is patent and arises from the true lumen. Inflow: Patent throughout. Dissection flap extends into the proximal right common iliac artery and proximal left external iliac artery. Proximal Outflow: Bilateral common femoral and visualized portions of the superficial and profunda femoral arteries are patent without evidence of aneurysm, dissection, vasculitis or significant stenosis. Veins: No obvious venous abnormality within the limitations of this arterial phase study. Review of the MIP images confirms the above findings. NON-VASCULAR Hepatobiliary: No focal hepatic lesions. No intra or extrahepatic biliary ductal dilation. Cholelithiasis. Pancreas: No focal lesions or main ductal dilation. Spleen: Normal in size without focal abnormality. Adrenals/Urinary Tract: No adrenal nodules.  Enhancing medial right upper pole lesion measuring 3.0 x 2.2 cm (10:186). No hydronephrosis or renal calculi. No focal bladder wall thickening. 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 No evidence of bowel wall thickening, distention, or inflammatory changes. Normal appendix. Lymphatic: No enlarged abdominal or pelvic lymph nodes. Reproductive: Prostate is unremarkable. Other: No free fluid, fluid collection, or free air. Musculoskeletal: No acute or abnormal lytic or blastic osseous lesions. Multilevel degenerative changes of the lumbar spine. Small fat-containing paraumbilical hernia. Review of the MIP images confirms the above findings. IMPRESSION: 1. 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 2.  Moderate effacement of the true lumen at the level of the mid descending aorta, which becomes severely effaced at the level of the distal descending aorta and diaphragmatic hiatus as well as at the level of the renal arteries. 3. Enhancing medial right upper pole renal lesion measuring 3.0 x 2.2 cm, which may represent primary renal neoplasm. If this finding has not been previously characterized, a nonemergent contrast-enhanced MRI of the abdomen or renal protocol CT is recommended. 4. Right axillary lymphadenopathy measuring up to 13 mm, indeterminate, however may be reactive to cutaneous thickening along the left lower anterolateral chest wall. Differential includes infection, inflammation, or posttraumatic contusion/hematoma. Recommend correlation with direct inspection. 5. Cholelithiasis. Critical Value/emergent results were called by telephone at the time of interpretation on 04/15/2023 at 11:10 am to provider Dr [facility] , who verbally acknowledged these results. Electronically Signed   By: Agustin Cree M.D.   On:  04/15/2023 11:32   DG Chest Port 1 View Result Date: 04/15/2023 CLINICAL DATA:  Acute onset sharp chest pain EXAM: PORTABLE CHEST 1 VIEW COMPARISON:  Chest radiograph dated 07/19/2016 FINDINGS: Low lung volumes with bronchovascular crowding. Bibasilar patchy opacities, left-greater-than-right. No pleural effusion or pneumothorax. Similar enlarged cardiomediastinal silhouette. Median sternotomy wires are nondisplaced. IMPRESSION: Low lung volumes with bronchovascular crowding. Bibasilar patchy opacities, left-greater-than-right, likely atelectasis. Aspiration or pneumonia can be considered in the appropriate clinical setting. Electronically Signed   By: Agustin Cree M.D.   On: 04/15/2023 09:16    Procedures Procedures    Medications Ordered in ED Medications  nicardipine (CARDENE) 20mg  in 0.86% saline IV infusion (0.1 mg/ml) (7.5 mg/hr Intravenous Rate/Dose Change 04/15/23 1142)  morphine (PF) 4 MG/ML injection 4 mg (has no administration in time range)  morphine (PF) 4 MG/ML injection 4 mg (4 mg Intravenous Given 04/15/23 0856)  ondansetron (ZOFRAN) injection 4 mg (4 mg Intravenous Given 04/15/23 0854)  iohexol (OMNIPAQUE) 350 MG/ML injection 100 mL (100 mLs Intravenous Contrast Given 04/15/23 1018)  morphine (PF) 4 MG/ML injection 4 mg (4 mg Intravenous Given 04/15/23 1042)  morphine (PF) 4 MG/ML injection 4 mg (4 mg Intravenous Given 04/15/23 1141)    ED Course/ Medical Decision Making/ A&P                                 Medical Decision Making Amount and/or Complexity of Data Reviewed External Data Reviewed: notes. Labs: ordered. Decision-making details documented in ED Course. Radiology: ordered and independent interpretation performed. Decision-making details documented in ED Course. ECG/medicine tests: ordered and independent interpretation performed. Decision-making details documented in ED Course.  Risk Prescription drug management. Decision regarding  hospitalization.   Pt with multiple medical problems and comorbidities and presenting today with a complaint that caries a high risk for morbidity and mortality.  Here today with a sudden onset of chest pain in the lower sternum and going into the back.  Patient has significant medical history for prior aortic root repair and valve repair due to Marfan's disease.  Concern for recurrent dissection, aneurysm or complication with prior repair versus PE versus pneumothorax versus ACS versus abdominal pathology such as cholecystitis, esophageal spasm, bowel obstruction.  Lower suspicion for AAA, myocarditis, pericarditis. Patient given pain and nausea control.  Blood pressure currently 160s over 60 and heart rate in the 60s.  Patient is on continuous cardiac monitoring with no evidence of dysrhythmia.  I independently interpreted patient's EKG which shows a sinus rhythm with  no significant ST changes concerning for STEMI.  Labs and imaging are pending.  11:56 AM I independently interpreted patient's labs and troponin is normal at 4, CBC without acute findings, D-dimer is elevated at 385, i-STAT Chem-8 without significant findings. I have independently visualized and interpreted pt's images today.  Chest x-ray with atelectasis but no other acute findings.  Due to strong concern for dissection CT of the chest abdomen pelvis was ordered which shows at this time a type B dissection extending from immediately distal to the left subclavian origin with dissection flap extending into the proximal right common iliac artery and proximal left external iliac artery.  There is also an enhancing medial right upper pole renal lesion measuring 3 x 2.2 cm which will need further evaluation in the future.  Speaking with the patient he was not aware of this nobody had mentioned it in the past.  Patient's blood pressure on repeat evaluation is 140s over 60s.  He has taken his blood pressure medication this morning and he was started  on nicardipine drip as his heart rates are in the 50s.  With morphine his pain seems to be controlled.  Consulted to vascular surgery. Findings discussed with the patient and his family.  12:51 PM Spoke with Dr. Juanetta Gosling who recommended speaking with [facility] as he has had his prior surgeries done there.  Spoke with the [facility] transfer line.  Dr. Kizzie Bane the patient's specialist has accepted him patient will be transferred directly to the emergency room as they have no ICU beds available at this time.  This was discussed with the patient and his wife.  CRITICAL CARE Performed by: Angello Chien Total critical care time: 45 minutes Critical care time was exclusive of separately billable procedures and treating other patients. Critical care was necessary to treat or prevent imminent or life-threatening deterioration. Critical care was time spent personally by me on the following activities: development of treatment plan with patient and/or surrogate as well as nursing, discussions with consultants, evaluation of patient's response to treatment, examination of patient, obtaining history from patient or surrogate, ordering and performing treatments and interventions, ordering and review of laboratory studies, ordering and review of radiographic studies, pulse oximetry and re-evaluation of patient's condition.          Final Clinical Impression(s) / ED Diagnoses Final diagnoses:  Dissection of thoracoabdominal aorta [facility])    Rx / DC Orders ED Discharge Orders     None         Gwyneth Sprout, MD 04/15/23 1252

## 2023-08-31 ENCOUNTER — Encounter (HOSPITAL_COMMUNITY): Payer: Self-pay | Admitting: *Deleted

## 2023-10-31 ENCOUNTER — Encounter (INDEPENDENT_AMBULATORY_CARE_PROVIDER_SITE_OTHER): Payer: Self-pay

## 2023-12-19 ENCOUNTER — Institutional Professional Consult (permissible substitution) (INDEPENDENT_AMBULATORY_CARE_PROVIDER_SITE_OTHER): Admitting: Family Medicine
